# Patient Record
Sex: Male | Born: 1985 | Race: Black or African American | Hispanic: No | Marital: Married | Smoking: Current every day smoker
Health system: Southern US, Community
[De-identification: ages and names within clinical notes are randomized; demographics above are authoritative.]

## PROBLEM LIST (undated history)

## (undated) DIAGNOSIS — Z8614 Personal history of Methicillin resistant Staphylococcus aureus infection: Secondary | ICD-10-CM

## (undated) HISTORY — PX: INCISE AND DRAIN ABCESS: PRO64

---

## 2008-02-29 ENCOUNTER — Emergency Department: Payer: Self-pay | Admitting: Emergency Medicine

## 2008-05-01 ENCOUNTER — Emergency Department: Payer: Self-pay | Admitting: Emergency Medicine

## 2008-05-02 ENCOUNTER — Emergency Department: Payer: Self-pay | Admitting: Internal Medicine

## 2012-12-01 ENCOUNTER — Emergency Department: Payer: Self-pay | Admitting: Internal Medicine

## 2012-12-21 ENCOUNTER — Ambulatory Visit: Payer: Self-pay | Admitting: Orthopedic Surgery

## 2014-07-03 ENCOUNTER — Emergency Department: Payer: Self-pay | Admitting: Emergency Medicine

## 2014-07-03 LAB — URINALYSIS, COMPLETE
Bacteria: NONE SEEN
Bilirubin,UR: NEGATIVE
Blood: NEGATIVE
Glucose,UR: NEGATIVE mg/dL (ref 0–75)
Ketone: NEGATIVE
Nitrite: NEGATIVE
Ph: 6 (ref 4.5–8.0)
Protein: NEGATIVE
RBC,UR: 2 /HPF (ref 0–5)
Specific Gravity: 1.026 (ref 1.003–1.030)
Squamous Epithelial: <1
WBC UR: 32 /HPF (ref 0–5)

## 2014-07-04 LAB — GC/CHLAMYDIA PROBE AMP

## 2016-05-19 ENCOUNTER — Ambulatory Visit: Payer: Self-pay

## 2016-05-22 ENCOUNTER — Telehealth: Payer: Self-pay

## 2016-05-22 NOTE — Telephone Encounter (Signed)
Cory Griffin from company called to have the chain of custody form and DOT PE from visit 05/19/16 FAXED to [phone] ATTENTION Cory Griffin

## 2016-05-23 NOTE — Telephone Encounter (Signed)
All requests need a medical release of records

## 2017-07-24 ENCOUNTER — Other Ambulatory Visit: Payer: Self-pay

## 2017-07-24 ENCOUNTER — Emergency Department: Payer: Self-pay

## 2017-07-24 ENCOUNTER — Encounter: Payer: Self-pay | Admitting: Emergency Medicine

## 2017-07-24 ENCOUNTER — Emergency Department
Admission: EM | Admit: 2017-07-24 | Discharge: 2017-07-24 | Disposition: A | Discharge status: Home / Self Care | Payer: Self-pay | Attending: Emergency Medicine | Admitting: Emergency Medicine

## 2017-07-24 DIAGNOSIS — Y929 Unspecified place or not applicable: Secondary | ICD-10-CM | POA: Insufficient documentation

## 2017-07-24 DIAGNOSIS — Y939 Activity, unspecified: Secondary | ICD-10-CM | POA: Insufficient documentation

## 2017-07-24 DIAGNOSIS — F17210 Nicotine dependence, cigarettes, uncomplicated: Secondary | ICD-10-CM | POA: Insufficient documentation

## 2017-07-24 DIAGNOSIS — S60221A Contusion of right hand, initial encounter: Secondary | ICD-10-CM | POA: Insufficient documentation

## 2017-07-24 DIAGNOSIS — Y999 Unspecified external cause status: Secondary | ICD-10-CM | POA: Insufficient documentation

## 2017-07-24 DIAGNOSIS — W19XXXA Unspecified fall, initial encounter: Secondary | ICD-10-CM | POA: Insufficient documentation

## 2017-07-24 MED ORDER — MELOXICAM 15 MG PO TABS: 15.0000 mg | ORAL_TABLET | Freq: Every day | ORAL | 2 refills | Status: AC

## 2017-07-24 MED ORDER — TRAMADOL HCL 50 MG PO TABS: 50.0000 mg | ORAL_TABLET | Freq: Four times a day (QID) | ORAL | 0 refills | Status: DC | PRN

## 2017-07-24 NOTE — Discharge Instructions (Signed)
Please follow-up with your regular doctor or the orthopedic doctor if you are not better in 1 week, wear the splint for at least 4-5 days to help with the swelling decreased, apply ice at least 3 times a day, use the medication as needed for pain and swelling, the meloxicam should be taken daily for at least 2 weeks, return to the emergency department if you are worsening
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
## 2017-07-24 NOTE — ED Triage Notes (Signed)
Pt fell on new years eve and c/o hand swelling to right hand. Ambulatory. NAD. No obvious deformity

## 2018-05-07 ENCOUNTER — Other Ambulatory Visit
Admission: AD | Admit: 2018-05-07 | Discharge: 2018-05-07 | Disposition: A | Discharge status: Home / Self Care | Payer: Self-pay | Attending: Emergency Medicine | Admitting: Emergency Medicine

## 2018-05-07 NOTE — ED Notes (Signed)
Patient ambulatory to triage with steady gait, without difficulty or distress noted, in custody of  [place] [employer] officer Selena Batten for forensic blood draw; pt A&Ox3, with no c/o voiced and denies need to see ED provider; pt voices good understanding of blood draw to be performed for forensic testing; pt verifies identity with name and DOB; using sealed kit provided by officer, tourniquet applied to right upper arm; right antecubital region prepped with betadine swab and allowed to dry completely; needle inserted and 2 grey top blood tubes collected; tourniquet removed, needle removed & intact, dressing applied; tubes labeled, given to officer and placed in sealed container using chain of custody; pt tolerated well and continues to deny c/o or need to see ED provider; pt d/c in police custody

## 2018-11-07 ENCOUNTER — Encounter: Payer: Self-pay | Admitting: Emergency Medicine

## 2018-11-07 ENCOUNTER — Emergency Department
Admission: EM | Admit: 2018-11-07 | Discharge: 2018-11-07 | Disposition: A | Discharge status: Home / Self Care | Payer: BLUE CROSS/BLUE SHIELD | Attending: Emergency Medicine | Admitting: Emergency Medicine

## 2018-11-07 ENCOUNTER — Other Ambulatory Visit: Payer: Self-pay

## 2018-11-07 DIAGNOSIS — Z202 Contact with and (suspected) exposure to infections with a predominantly sexual mode of transmission: Secondary | ICD-10-CM | POA: Diagnosis present

## 2018-11-07 MED ORDER — AZITHROMYCIN 500 MG PO TABS
1000.0000 mg | ORAL_TABLET | Freq: Once | ORAL | Status: AC
  Administered 2018-11-07: 11:00:00 1000 mg via ORAL
  Filled 2018-11-07: qty 2

## 2018-11-07 NOTE — ED Provider Notes (Signed)
[facility] Emergency Department Provider Note  ____________________________________________  Time seen: Approximately 10:20 AM  I have reviewed the triage vital signs and the nursing notes.   HISTORY  Chief Complaint SEXUALLY TRANSMITTED DISEASE    HPI Cory Griffin. is a 33 y.o. male who presents to the emergency department for treatment of STD.  Wife was diagnosed with chlamydia earlier this week.  Patient has no symptoms.  He has no chronic medical history and denies history of medication allergies.   History reviewed. No pertinent past medical history.  There are no active problems to display for this patient.   History reviewed. No pertinent surgical history.  Prior to Admission medications   Not on File    Allergies Patient has no known allergies.  No family history on file.  Social History Social History   Tobacco Use  . Smoking status: Current Every Day Smoker  . Smokeless tobacco: Never Used  Substance Use Topics  . Alcohol use: Yes  . Drug use: No    Review of Systems Constitutional: Negative for fever. Respiratory: Negative for shortness of breath or cough. Gastrointestinal: Negative for abdominal pain; negative for nausea , negative for vomiting. Genitourinary: Negative for dysuria , negative for penile discharge. Musculoskeletal: Negative for back pain. Skin: Negative for acute skin changes/rash/lesion. ____________________________________________   PHYSICAL EXAM:  VITAL SIGNS: ED Triage Vitals [11/07/18 1007]  Enc Vitals Group     BP 130/89     Pulse Rate 84     Resp 16     Temp 98 F (36.7 C)     Temp Source Oral     SpO2 98 %     Weight      Height      Head Circumference      Peak Flow      Pain Score      Pain Loc      Pain Edu?      Excl. in GC?     Constitutional: Alert and oriented. Well appearing and in no acute distress. Eyes: Conjunctivae are normal. Head: Atraumatic. Nose: No  congestion/rhinnorhea. Mouth/Throat: Mucous membranes are moist. Respiratory: Normal respiratory effort.  No retractions. Gastrointestinal: Bowel sounds active x 4; Abdomen is soft without rebound or guarding. Genitourinary: Exam deferred Musculoskeletal: No extremity tenderness nor edema.  Neurologic:  Normal speech and language. No gross focal neurologic deficits are appreciated. Speech is normal. No gait instability. Skin:  Skin is warm, dry and intact. No rash noted on exposed skin. Psychiatric: Mood and affect are normal. Speech and behavior are normal.  ____________________________________________   LABS (all labs ordered are listed, but only abnormal results are displayed)  Labs Reviewed - No data to display ____________________________________________  RADIOLOGY  Not indicated ____________________________________________  Procedures  ____________________________________________  33 year old male presents for treatment of chlamydia after his wife was diagnosed a couple of days ago.  Will be treated with azithromycin since the type of STD is known.  He was advised to avoid intercourse for the week.  He was advised that he should follow-up with the [facility] department for symptoms of concern.  He is to return to the emergency department if unable to schedule an appointment.  INITIAL IMPRESSION / ASSESSMENT AND PLAN / ED COURSE  Pertinent labs & imaging results that were available during my care of the patient were reviewed by me and considered in my medical decision making (see chart for details).  ____________________________________________   FINAL CLINICAL IMPRESSION(S) /  ED DIAGNOSES  Final diagnoses:  Exposure to chlamydia    Note:  This document was prepared using Dragon voice recognition software and may include unintentional dictation errors.   Chinita Pesterriplett, Constantine Ruddick B, FNP 11/07/18 1351    Jeanmarie PlantMcShane, James A, MD 11/07/18 1535

## 2018-11-07 NOTE — ED Triage Notes (Signed)
PT states spouse was dx with PID and was told he needs to come and get treated. PT denies any symptoms.

## 2018-11-07 NOTE — ED Notes (Signed)
See triage note  Presents for treatment of STD  States wife was seen and dx'd with chlamydia this week  Denies any sx's at present

## 2018-11-07 NOTE — Discharge Instructions (Signed)
No intercourse for a week.  Follow up with the health department for symptoms of concern.

## 2019-07-18 DIAGNOSIS — W19XXXA Unspecified fall, initial encounter: Secondary | ICD-10-CM

## 2019-07-18 HISTORY — DX: Unspecified fall, initial encounter: W19.XXXA

## 2020-07-01 ENCOUNTER — Emergency Department (HOSPITAL_COMMUNITY): Payer: Medicaid Other

## 2020-07-01 ENCOUNTER — Encounter (HOSPITAL_COMMUNITY): Payer: Self-pay | Admitting: Emergency Medicine

## 2020-07-01 ENCOUNTER — Other Ambulatory Visit: Payer: Self-pay

## 2020-07-01 ENCOUNTER — Inpatient Hospital Stay (HOSPITAL_COMMUNITY)
Admission: EM | Admit: 2020-07-01 | Discharge: 2020-07-04 | DRG: 956 | Disposition: A | Discharge status: Home / Self Care | Payer: Medicaid Other | Attending: Surgery | Admitting: Surgery

## 2020-07-01 DIAGNOSIS — S0240CA Maxillary fracture, right side, initial encounter for closed fracture: Secondary | ICD-10-CM | POA: Diagnosis present

## 2020-07-01 DIAGNOSIS — S025XXA Fracture of tooth (traumatic), initial encounter for closed fracture: Secondary | ICD-10-CM | POA: Diagnosis present

## 2020-07-01 DIAGNOSIS — R7401 Elevation of levels of liver transaminase levels: Secondary | ICD-10-CM | POA: Diagnosis present

## 2020-07-01 DIAGNOSIS — Z23 Encounter for immunization: Secondary | ICD-10-CM

## 2020-07-01 DIAGNOSIS — S0081XA Abrasion of other part of head, initial encounter: Secondary | ICD-10-CM | POA: Diagnosis present

## 2020-07-01 DIAGNOSIS — S02401A Maxillary fracture, unspecified, initial encounter for closed fracture: Secondary | ICD-10-CM

## 2020-07-01 DIAGNOSIS — S7290XA Unspecified fracture of unspecified femur, initial encounter for closed fracture: Secondary | ICD-10-CM

## 2020-07-01 DIAGNOSIS — J939 Pneumothorax, unspecified: Secondary | ICD-10-CM

## 2020-07-01 DIAGNOSIS — S52102A Unspecified fracture of upper end of left radius, initial encounter for closed fracture: Secondary | ICD-10-CM

## 2020-07-01 DIAGNOSIS — S0219XA Other fracture of base of skull, initial encounter for closed fracture: Secondary | ICD-10-CM | POA: Diagnosis present

## 2020-07-01 DIAGNOSIS — Z20822 Contact with and (suspected) exposure to covid-19: Secondary | ICD-10-CM | POA: Diagnosis present

## 2020-07-01 DIAGNOSIS — S0231XA Fracture of orbital floor, right side, initial encounter for closed fracture: Secondary | ICD-10-CM | POA: Diagnosis present

## 2020-07-01 DIAGNOSIS — S62111A Displaced fracture of triquetrum [cuneiform] bone, right wrist, initial encounter for closed fracture: Secondary | ICD-10-CM

## 2020-07-01 DIAGNOSIS — S728X1A Other fracture of right femur, initial encounter for closed fracture: Principal | ICD-10-CM | POA: Diagnosis present

## 2020-07-01 DIAGNOSIS — T1490XA Injury, unspecified, initial encounter: Secondary | ICD-10-CM

## 2020-07-01 DIAGNOSIS — T148XXA Other injury of unspecified body region, initial encounter: Secondary | ICD-10-CM

## 2020-07-01 DIAGNOSIS — S2241XA Multiple fractures of ribs, right side, initial encounter for closed fracture: Secondary | ICD-10-CM

## 2020-07-01 DIAGNOSIS — W19XXXA Unspecified fall, initial encounter: Secondary | ICD-10-CM | POA: Diagnosis present

## 2020-07-01 DIAGNOSIS — W132XXA Fall from, out of or through roof, initial encounter: Secondary | ICD-10-CM | POA: Diagnosis present

## 2020-07-01 DIAGNOSIS — S0285XA Fracture of orbit, unspecified, initial encounter for closed fracture: Secondary | ICD-10-CM

## 2020-07-01 DIAGNOSIS — S52134A Nondisplaced fracture of neck of right radius, initial encounter for closed fracture: Secondary | ICD-10-CM | POA: Diagnosis present

## 2020-07-01 DIAGNOSIS — F17200 Nicotine dependence, unspecified, uncomplicated: Secondary | ICD-10-CM | POA: Diagnosis present

## 2020-07-01 DIAGNOSIS — Z5320 Procedure and treatment not carried out because of patient's decision for unspecified reasons: Secondary | ICD-10-CM | POA: Diagnosis not present

## 2020-07-01 DIAGNOSIS — S72001A Fracture of unspecified part of neck of right femur, initial encounter for closed fracture: Secondary | ICD-10-CM

## 2020-07-01 DIAGNOSIS — S270XXA Traumatic pneumothorax, initial encounter: Secondary | ICD-10-CM

## 2020-07-01 LAB — COMPREHENSIVE METABOLIC PANEL
ALT: 59 U/L — ABNORMAL HIGH (ref 0–44)
AST: 61 U/L — ABNORMAL HIGH (ref 15–41)
Albumin: 3.9 g/dL (ref 3.5–5.0)
Alkaline Phosphatase: 52 U/L (ref 38–126)
Anion gap: 12 (ref 5–15)
BUN: 7 mg/dL (ref 6–20)
CO2: 23 mmol/L (ref 22–32)
Calcium: 8.8 mg/dL — ABNORMAL LOW (ref 8.9–10.3)
Chloride: 98 mmol/L (ref 98–111)
Creatinine, Ser: 0.76 mg/dL (ref 0.61–1.24)
GFR, Estimated: >60 mL/min (ref >60)
Glucose, Bld: 119 mg/dL — ABNORMAL HIGH (ref 70–99)
Potassium: 3.8 mmol/L (ref 3.5–5.1)
Sodium: 133 mmol/L — ABNORMAL LOW (ref 135–145)
Total Bilirubin: 1.3 mg/dL — ABNORMAL HIGH (ref 0.3–1.2)
Total Protein: 6.2 g/dL — ABNORMAL LOW (ref 6.5–8.1)

## 2020-07-01 LAB — CBC WITH DIFFERENTIAL/PLATELET
Abs Immature Granulocytes: 0.06 10*3/uL (ref 0.00–0.07)
Basophils Absolute: 0 10*3/uL (ref 0.0–0.1)
Basophils Relative: 0 %
Eosinophils Absolute: 0 10*3/uL (ref 0.0–0.5)
Eosinophils Relative: 0 %
HCT: 43.4 % (ref 39.0–52.0)
Hemoglobin: 14.5 g/dL (ref 13.0–17.0)
Immature Granulocytes: 0 %
Lymphocytes Relative: 7 %
Lymphs Abs: 1 10*3/uL (ref 0.7–4.0)
MCH: 33.9 pg (ref 26.0–34.0)
MCHC: 33.4 g/dL (ref 30.0–36.0)
MCV: 101.4 fL — ABNORMAL HIGH (ref 80.0–100.0)
Monocytes Absolute: 0.8 10*3/uL (ref 0.1–1.0)
Monocytes Relative: 6 %
Neutro Abs: 12.8 10*3/uL — ABNORMAL HIGH (ref 1.7–7.7)
Neutrophils Relative %: 87 %
Platelets: 177 10*3/uL (ref 150–400)
RBC: 4.28 MIL/uL (ref 4.22–5.81)
RDW: 11.5 % (ref 11.5–15.5)
WBC: 14.8 10*3/uL — ABNORMAL HIGH (ref 4.0–10.5)
nRBC: 0 % (ref 0.0–0.2)

## 2020-07-01 LAB — URINALYSIS, ROUTINE W REFLEX MICROSCOPIC
Bilirubin Urine: NEGATIVE
Glucose, UA: NEGATIVE mg/dL
Hgb urine dipstick: NEGATIVE
Ketones, ur: NEGATIVE mg/dL
Leukocytes,Ua: NEGATIVE
Nitrite: NEGATIVE
Protein, ur: NEGATIVE mg/dL
Specific Gravity, Urine: 1.041 — ABNORMAL HIGH (ref 1.005–1.030)
pH: 7 (ref 5.0–8.0)

## 2020-07-01 LAB — I-STAT CHEM 8, ED
BUN: 7 mg/dL (ref 6–20)
Calcium, Ion: 1.05 mmol/L — ABNORMAL LOW (ref 1.15–1.40)
Chloride: 102 mmol/L (ref 98–111)
Creatinine, Ser: 0.6 mg/dL — ABNORMAL LOW (ref 0.61–1.24)
Glucose, Bld: 120 mg/dL — ABNORMAL HIGH (ref 70–99)
HCT: 45 % (ref 39.0–52.0)
Hemoglobin: 15.3 g/dL (ref 13.0–17.0)
Potassium: 3.5 mmol/L (ref 3.5–5.1)
Sodium: 135 mmol/L (ref 135–145)
TCO2: 23 mmol/L (ref 22–32)

## 2020-07-01 LAB — SAMPLE TO BLOOD BANK

## 2020-07-01 LAB — RESP PANEL BY RT-PCR (FLU A&B, COVID) ARPGX2
Influenza A by PCR: NEGATIVE
Influenza B by PCR: NEGATIVE
SARS Coronavirus 2 by RT PCR: NEGATIVE

## 2020-07-01 LAB — LACTIC ACID, PLASMA: Lactic Acid, Venous: 1.2 mmol/L (ref 0.5–1.9)

## 2020-07-01 LAB — PROTIME-INR
INR: 1 (ref 0.8–1.2)
Prothrombin Time: 13 seconds (ref 11.4–15.2)

## 2020-07-01 LAB — ETHANOL: Alcohol, Ethyl (B): <10 mg/dL (ref <10)

## 2020-07-01 MED ORDER — FENTANYL CITRATE (PF) 100 MCG/2ML IJ SOLN
50.0000 ug | Freq: Once | INTRAMUSCULAR | Status: AC | PRN
  Administered 2020-07-01: 50 ug via INTRAVENOUS
  Filled 2020-07-01: qty 2

## 2020-07-01 MED ORDER — OXYCODONE HCL 5 MG PO TABS: 5.0000 mg | ORAL_TABLET | ORAL | Status: DC | PRN

## 2020-07-01 MED ORDER — MORPHINE SULFATE (PF) 4 MG/ML IV SOLN
4.0000 mg | INTRAVENOUS | Status: DC | PRN
  Administered 2020-07-01: 4 mg via INTRAVENOUS
  Filled 2020-07-01: qty 1

## 2020-07-01 MED ORDER — TETANUS-DIPHTH-ACELL PERTUSSIS 5-2.5-18.5 LF-MCG/0.5 IM SUSY
0.5000 mL | PREFILLED_SYRINGE | Freq: Once | INTRAMUSCULAR | Status: AC
  Administered 2020-07-01: 16:00:00 0.5 mL via INTRAMUSCULAR
  Filled 2020-07-01: qty 0.5

## 2020-07-01 MED ORDER — FENTANYL CITRATE (PF) 100 MCG/2ML IJ SOLN: 50.0000 ug | Freq: Once | INTRAMUSCULAR | Status: DC

## 2020-07-01 MED ORDER — ENOXAPARIN SODIUM 30 MG/0.3ML ~~LOC~~ SOLN
30.0000 mg | Freq: Two times a day (BID) | SUBCUTANEOUS | Status: DC
  Administered 2020-07-02: 09:00:00 30 mg via SUBCUTANEOUS
  Filled 2020-07-01: qty 0.3

## 2020-07-01 MED ORDER — OXYCODONE HCL 5 MG PO TABS
10.0000 mg | ORAL_TABLET | ORAL | Status: DC | PRN
  Administered 2020-07-01 – 2020-07-02 (×3): 10 mg via ORAL
  Filled 2020-07-01 (×3): qty 2

## 2020-07-01 MED ORDER — MORPHINE SULFATE (PF) 2 MG/ML IV SOLN
1.0000 mg | INTRAVENOUS | Status: DC | PRN
Start: 2020-07-01 — End: 2020-07-01

## 2020-07-01 MED ORDER — METHOCARBAMOL 1000 MG/10ML IJ SOLN
500.0000 mg | Freq: Three times a day (TID) | INTRAVENOUS | Status: DC
  Administered 2020-07-01 – 2020-07-02 (×2): 500 mg via INTRAVENOUS
  Filled 2020-07-01 (×9): qty 5

## 2020-07-01 MED ORDER — LACTATED RINGERS IV SOLN
INTRAVENOUS | Status: AC | PRN
  Administered 2020-07-01: 1000 mL via INTRAVENOUS

## 2020-07-01 MED ORDER — ONDANSETRON HCL 4 MG/2ML IJ SOLN: 4.0000 mg | Freq: Four times a day (QID) | INTRAMUSCULAR | Status: DC | PRN

## 2020-07-01 MED ORDER — ACETAMINOPHEN 500 MG PO TABS
1000.0000 mg | ORAL_TABLET | Freq: Three times a day (TID) | ORAL | Status: DC
  Administered 2020-07-02 – 2020-07-04 (×5): 1000 mg via ORAL
  Filled 2020-07-01 (×7): qty 2

## 2020-07-01 MED ORDER — POTASSIUM CHLORIDE IN NACL 20-0.9 MEQ/L-% IV SOLN
INTRAVENOUS | Status: DC
  Filled 2020-07-01 (×5): qty 1000

## 2020-07-01 MED ORDER — PANTOPRAZOLE SODIUM 40 MG PO TBEC
40.0000 mg | DELAYED_RELEASE_TABLET | Freq: Every day | ORAL | Status: DC
  Administered 2020-07-01 – 2020-07-04 (×4): 40 mg via ORAL
  Filled 2020-07-01 (×4): qty 1

## 2020-07-01 MED ORDER — MORPHINE SULFATE (PF) 2 MG/ML IV SOLN
1.0000 mg | INTRAVENOUS | Status: DC | PRN
  Administered 2020-07-01 – 2020-07-02 (×4): 2 mg via INTRAVENOUS
  Filled 2020-07-01 (×4): qty 1

## 2020-07-01 MED ORDER — ONDANSETRON 4 MG PO TBDP
4.0000 mg | ORAL_TABLET | Freq: Four times a day (QID) | ORAL | Status: DC | PRN
  Administered 2020-07-01: 21:00:00 4 mg via ORAL
  Filled 2020-07-01 (×2): qty 1

## 2020-07-01 MED ORDER — SODIUM CHLORIDE 0.9 % IV SOLN: Freq: Once | INTRAVENOUS | Status: DC

## 2020-07-01 MED ORDER — PANTOPRAZOLE SODIUM 40 MG IV SOLR
40.0000 mg | Freq: Every day | INTRAVENOUS | Status: DC
  Administered 2020-07-01: 21:00:00 40 mg via INTRAVENOUS
  Filled 2020-07-01 (×2): qty 40

## 2020-07-01 MED ORDER — IOHEXOL 300 MG/ML  SOLN
100.0000 mL | Freq: Once | INTRAMUSCULAR | Status: AC | PRN
  Administered 2020-07-01: 16:00:00 100 mL via INTRAVENOUS

## 2020-07-01 MED ORDER — DOCUSATE SODIUM 100 MG PO CAPS
100.0000 mg | ORAL_CAPSULE | Freq: Two times a day (BID) | ORAL | Status: DC
  Administered 2020-07-02 – 2020-07-03 (×2): 100 mg via ORAL
  Filled 2020-07-01 (×5): qty 1

## 2020-07-01 NOTE — Progress Notes (Signed)
Orthopedic Tech Progress Note Patient Details:  Cory Griffin 1986-02-28 [id number]  Patient ID: [name], male   DOB: 02-23-1986, 34 y.o.   MRN: [id number]  Level 2 Trauma. [name] A [name] 07/01/2020, 4:13 PM
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
## 2020-07-01 NOTE — ED Provider Notes (Signed)
Patient not in room for evaluation. I did not see or evaluate this patient prior to shift change.   Note: Portions of this report may have been transcribed using voice recognition software. Every effort was made to ensure accuracy; however, inadvertent computerized transcription errors may still be present.    Bill Salinas, PA-C 07/01/20 1458    Gwyneth Sprout, MD 07/02/20 1322

## 2020-07-01 NOTE — H&P (Addendum)
CC: I hurt on my right side  Requesting provider: dr Charm Barges  HPI: Cory Griffin. is an 34 y.o. male who is here for evaluation after falling off of a roof as level 2 trauma alert. Reportedly fell 60ft, landing on right side, gcs 15, reporedly had 1 beer earlier today. Evaluated and worked up by ED and called to admit given multiple systems. Pt denies neck pain, abd pain and LUE, LLE, right tib/foot pain. C/o right sided pain - elbow, chest, hand, hip.   Denies pmhx, psx, all, meds, family hx Smokes cig daily, pack per 1.5 day Some etoh  Denies other drugs History reviewed. No pertinent past medical history.  History reviewed. No pertinent surgical history.  No family history on file.  Social:  reports that he has been smoking. He has never used smokeless tobacco. He reports current alcohol use. He reports that he does not use drugs.  Allergies: No Known Allergies  Medications: I have reviewed the patient's current medications.   ROS - all of the below systems have been reviewed with the patient and positives are indicated with bold text General: chills, fever or night sweats Eyes: blurry vision or double vision ENT: epistaxis or sore throat Allergy/Immunology: itchy/watery eyes or nasal congestion Hematologic/Lymphatic: bleeding problems, blood clots or swollen lymph nodes Endocrine: temperature intolerance or unexpected weight changes Breast: new or changing breast lumps or nipple discharge Resp: cough, shortness of breath, or wheezing CV: chest pain or dyspnea on exertion GI: as per HPI GU: dysuria, trouble voiding, or hematuria MSK: joint pain or joint stiffness; see hpi Neuro: TIA or stroke symptoms Derm: pruritus and skin lesion changes Psych: anxiety and depression  PE Blood pressure 101/89, pulse 96, temperature (!) 97 F (36.1 C), resp. rate 16, height 6\' 1"  (1.854 m), weight 74.4 kg, SpO2 98 %. Constitutional: NAD; conversant; appears uncomfortable at  times Eyes: Moist conjunctiva; no lid lag; anicteric; PERRL Face: some right facial tenderness  Neck: Trachea midline; no thyromegaly, supple, FROM Lungs: Normal respiratory effort; no tactile fremitus CV: RRR; no palpable thrills; no pitting edema GI: Abd soft, nontender, nondistended; no palpable hepatosplenomegaly MSK: no clubbing/cyanosis; TTP right hip/prox femur, TTP right elbow, prox radius, right hand; o/w nontender to palpation LUE, LLE, b/l feet; good 2+ pulses dp/pt/radial b/l Psychiatric: Appropriate affect; alert and oriented x3 Lymphatic: No palpable cervical or axillary lymphadenopathy Skin:no rash, lesions, nodules  Results for orders placed or performed during the hospital encounter of 07/01/20 (from the past 48 hour(s))  Comprehensive metabolic panel     Status: Abnormal   Collection Time: 07/01/20  4:24 PM  Result Value Ref Range   Sodium 133 (L) 135 - 145 mmol/L   Potassium 3.8 3.5 - 5.1 mmol/L   Chloride 98 98 - 111 mmol/L   CO2 23 22 - 32 mmol/L   Glucose, Bld 119 (H) 70 - 99 mg/dL    Comment: Glucose reference range applies only to samples taken after fasting for at least 8 hours.   BUN 7 6 - 20 mg/dL   Creatinine, Ser 07/03/20 0.61 - 1.24 mg/dL   Calcium 8.8 (L) 8.9 - 10.3 mg/dL   Total Protein 6.2 (L) 6.5 - 8.1 g/dL   Albumin 3.9 3.5 - 5.0 g/dL   AST 61 (H) 15 - 41 U/L   ALT 59 (H) 0 - 44 U/L   Alkaline Phosphatase 52 38 - 126 U/L   Total Bilirubin 1.3 (H) 0.3 - 1.2 mg/dL  GFR, Estimated >60 >60 mL/min    Comment: (NOTE) Calculated using the CKD-EPI Creatinine Equation (2021)    Anion gap 12 5 - 15    Comment: Performed at [facility], [address] [place], [place] [address]  Ethanol     Status: None   Collection Time: 07/01/20  4:24 PM  Result Value Ref Range   Alcohol, Ethyl (B) <10 <10 mg/dL    Comment: (NOTE) Lowest detectable limit for serum alcohol is 10 mg/dL.  For medical purposes only. Performed at [facility], [address], [place], [place] [address]   Lactic acid, plasma     Status: None   Collection Time: 07/01/20  4:24 PM  Result Value Ref Range   Lactic Acid, Venous 1.2 0.5 - 1.9 mmol/L    Comment: Performed at [facility], [address] [place], [place] [address]  Protime-INR     Status: None   Collection Time: 07/01/20  4:24 PM  Result Value Ref Range   Prothrombin Time 13.0 11.4 - 15.2 seconds   INR 1.0 0.8 - 1.2    Comment: (NOTE) INR goal varies based on device and disease states. Performed at [facility], [address] [place], [place] [address]   Sample to Blood Bank     Status: None   Collection Time: 07/01/20  4:24 PM  Result Value Ref Range   Blood Bank Specimen SAMPLE AVAILABLE FOR TESTING    Sample Expiration      07/02/2020,2359 Performed at [facility], [address], [place], [place] [address]   Resp Panel by RT-PCR (Flu A&B, Covid) Nasopharyngeal Swab     Status: None   Collection Time: 07/01/20  4:24 PM   Specimen: Nasopharyngeal Swab; Nasopharyngeal(NP) swabs in vial transport medium  Result Value Ref Range   SARS Coronavirus 2 by RT PCR NEGATIVE NEGATIVE    Comment: (NOTE) SARS-CoV-2 target nucleic acids are NOT DETECTED.  The SARS-CoV-2 RNA is generally detectable in upper respiratory specimens during the acute phase of infection. The lowest concentration of SARS-CoV-2 viral copies this assay can detect is 138 copies/mL. A negative result does not preclude SARS-Cov-2 infection and should not be used as the sole basis for treatment or other patient management decisions. A negative result may occur with  improper specimen collection/handling, submission of specimen other than nasopharyngeal swab, presence of viral mutation(s) within the areas targeted by this assay, and inadequate number of viral copies(<138 copies/mL). A negative result must be combined with clinical observations, patient history, and epidemiological information. The  expected result is Negative.  Fact Sheet for Patients:  BloggerCourse.com  Fact Sheet for Healthcare Providers:  SeriousBroker.it  This test is no t yet approved or cleared by the [place] FDA and  has been authorized for detection and/or diagnosis of SARS-CoV-2 by FDA under an Emergency Use Authorization (EUA). This EUA will remain  in effect (meaning this test can be used) for the duration of the COVID-19 declaration under Section 564(b)(1) of the Act, 21 U.S.C.section 360bbb-3(b)(1), unless the authorization is terminated  or revoked sooner.       Influenza A by PCR NEGATIVE NEGATIVE   Influenza B by PCR NEGATIVE NEGATIVE    Comment: (NOTE) The Xpert Xpress SARS-CoV-2/FLU/RSV plus assay is intended as an aid in the diagnosis of influenza from Nasopharyngeal swab specimens and should not be used as a sole basis for treatment. Nasal washings and aspirates are unacceptable  for Xpert Xpress SARS-CoV-2/FLU/RSV testing.  Fact Sheet for Patients: BloggerCourse.com  Fact Sheet for Healthcare Providers: SeriousBroker.it  This test is not yet approved or cleared by the [place] FDA and has been authorized for detection and/or diagnosis of SARS-CoV-2 by FDA under an Emergency Use Authorization (EUA). This EUA will remain in effect (meaning this test can be used) for the duration of the COVID-19 declaration under Section 564(b)(1) of the Act, 21 U.S.C. section 360bbb-3(b)(1), unless the authorization is terminated or revoked.  Performed at [facility], [address] [place], [place] [address]   CBC with Differential     Status: Abnormal   Collection Time: 07/01/20  4:24 PM  Result Value Ref Range   WBC 14.8 (H) 4.0 - 10.5 K/uL   RBC 4.28 4.22 - 5.81 MIL/uL   Hemoglobin 14.5 13.0 - 17.0 g/dL   HCT 60.4 54.0 - 98.1 %   MCV 101.4 (H) 80.0 - 100.0 fL   MCH 33.9  26.0 - 34.0 pg   MCHC 33.4 30.0 - 36.0 g/dL   RDW 19.1 47.8 - 29.5 %   Platelets 177 150 - 400 K/uL   nRBC 0.0 0.0 - 0.2 %   Neutrophils Relative % 87 %   Neutro Abs 12.8 (H) 1.7 - 7.7 K/uL   Lymphocytes Relative 7 %   Lymphs Abs 1.0 0.7 - 4.0 K/uL   Monocytes Relative 6 %   Monocytes Absolute 0.8 0.1 - 1.0 K/uL   Eosinophils Relative 0 %   Eosinophils Absolute 0.0 0.0 - 0.5 K/uL   Basophils Relative 0 %   Basophils Absolute 0.0 0.0 - 0.1 K/uL   Immature Granulocytes 0 %   Abs Immature Granulocytes 0.06 0.00 - 0.07 K/uL    Comment: Performed at [facility], [address] [place], [place] [address]  I-Stat Chem 8, ED     Status: Abnormal   Collection Time: 07/01/20  4:41 PM  Result Value Ref Range   Sodium 135 135 - 145 mmol/L   Potassium 3.5 3.5 - 5.1 mmol/L   Chloride 102 98 - 111 mmol/L   BUN 7 6 - 20 mg/dL   Creatinine, Ser 8.65 (L) 0.61 - 1.24 mg/dL   Glucose, Bld 784 (H) 70 - 99 mg/dL    Comment: Glucose reference range applies only to samples taken after fasting for at least 8 hours.   Calcium, Ion 1.05 (L) 1.15 - 1.40 mmol/L   TCO2 23 22 - 32 mmol/L   Hemoglobin 15.3 13.0 - 17.0 g/dL   HCT 69.6 29.5 - 28.4 %    DG Chest 1 View  Result Date: 07/01/2020 CLINICAL DATA:  Fall from roof EXAM: CHEST  1 VIEW COMPARISON:  CT 07/01/2020 FINDINGS: The heart size and mediastinal contours are within normal limits. Both lungs are clear. The visualized skeletal structures are unremarkable. Nondisplaced fractures identified on companion CT involving the posterior RIGHT lower ribs are not evident by plain film. IMPRESSION: 1. Posterior RIGHT rib fracture identified on companion CT are not clearly evident. 2. No pneumothorax. Electronically Signed   By: Genevive Bi M.D.   On: 07/01/2020 16:08   DG Shoulder Right  Result Date: 07/01/2020 CLINICAL DATA:  34 year old male with fall and right shoulder pain. EXAM: RIGHT SHOULDER - 2+ VIEW COMPARISON:  None. FINDINGS: There  is no acute fracture or dislocation of the right shoulder. There are fractures of the posterior as well as anterior right seventh rib. Dedicated rib series  may provide better evaluation. Probable mild subcutaneous contusion lateral to the shoulder. No radiopaque foreign object or soft tissue gas. IMPRESSION: 1. No acute fracture or dislocation of the right shoulder. 2. Fractures of the posterior and anterior right seventh rib. Dedicated rib series may provide better evaluation. Electronically Signed   By: Elgie Collard M.D.   On: 07/01/2020 15:32   DG Elbow 2 Views Right  Result Date: 07/01/2020 CLINICAL DATA:  Fall from roof with elbow pain, initial encounter EXAM: RIGHT ELBOW - 1 VIEW COMPARISON:  None. FINDINGS: Transverse fracture is noted through the radial neck just below the radial head with elevation of the anterior and posterior fat pads consistent with joint effusion. No other fracture is noted. IMPRESSION: Proximal radial fracture involving the neck as described. Electronically Signed   By: Alcide Clever M.D.   On: 07/01/2020 17:26   DG Forearm Right  Result Date: 07/01/2020 CLINICAL DATA:  Fall from roof with forearm pain, initial encounter EXAM: RIGHT FOREARM - 2 VIEW COMPARISON:  None. FINDINGS: There is a fracture through the radial neck with only minimal displacement identified. No other focal abnormality is noted. IMPRESSION: Proximal radius fracture involving the neck incompletely evaluated on this exam. Electronically Signed   By: Alcide Clever M.D.   On: 07/01/2020 17:25   DG Wrist Complete Right  Result Date: 07/01/2020 CLINICAL DATA:  Fall off roof with right wrist pain. EXAM: RIGHT WRIST - COMPLETE 3+ VIEW COMPARISON:  None. FINDINGS: There is no evidence of fracture or dislocation. There is no evidence of arthropathy or other focal bone abnormality. Soft tissues are unremarkable. IMPRESSION: Negative radiographs of the right wrist. Electronically Signed   By: Narda Rutherford  M.D.   On: 07/01/2020 15:31   CT Head Wo Contrast  Result Date: 07/01/2020 CLINICAL DATA:  Head trauma, moderate/severe. Neck trauma, dangerous injury mechanism. Additional history provided: Fall off of roof. EXAM: CT HEAD WITHOUT CONTRAST CT CERVICAL SPINE WITHOUT CONTRAST TECHNIQUE: Multidetector CT imaging of the head and cervical spine was performed following the standard protocol without intravenous contrast. Multiplanar CT image reconstructions of the cervical spine were also generated. COMPARISON:  No pertinent prior exams available for comparison. FINDINGS: CT HEAD FINDINGS Brain: Cerebral volume is normal. There is no acute intracranial hemorrhage. No demarcated cortical infarct. No extra-axial fluid collection. No evidence of intracranial mass. No midline shift. Vascular: No hyperdense vessel. Skull: Deformity of the right sphenoid bone along the lateral aspect of the right orbital apex, likely reflecting a mildly displaced acute fracture (for instance as seen on series 4, image 31) (series 5, image 25). Sinuses/Orbits: Subtle irregularity of the right orbital floor is questioned and a nondisplaced fracture is difficult to exclude. Right periorbital and maxillofacial soft tissue swelling. Acute, mildly displaced fractures of the posterolateral wall of the right maxillary sinus. Air-fluid level within the right maxillary sinus likely reflecting the presence of hemorrhage. Small volume frothy secretions, small air-fluid level and small mucous retention cyst within the right sphenoid sinus. Mild paranasal sinus mucosal thickening elsewhere. Other: Nondisplaced acute fracture of the right maxillary hard palate (for instance as seen on series 4, image 12). CT CERVICAL SPINE FINDINGS Alignment: Mild nonspecific reversal of the expected cervical lordosis. No significant spondylolisthesis. Skull base and vertebrae: The basion-dental and atlanto-dental intervals are maintained.No evidence of acute fracture to  the cervical spine. Soft tissues and spinal canal: No prevertebral fluid or swelling. No visible canal hematoma. Disc levels: No significant bony spinal canal or neural foraminal  narrowing at any level. Upper chest: No visible airspace consolidation or pneumothorax. IMPRESSION: CT head: 1. No evidence of acute intracranial abnormality. 2. Mildly displaced fracture of the right sphenoid bone along the lateral aspect of the right orbital apex. 3. Acute, mildly displaced fractures of the posterolateral wall of the right maxillary sinus. Nondisplaced acute fracture through the right maxillary hard palate. Subtle irregularity of the right orbital floor is also questioned and a nondisplaced right orbital floor fracture is difficult to exclude. A dedicated maxillofacial CT is recommended for further evaluation of these maxillofacial and skull base fractures. 4. Large air-fluid level within the right maxillary sinus likely reflecting the presence of blood products. 5. A small air-fluid level is also present within the right sphenoid sinus, which may reflect secretions or blood products. 6. Right periorbital/maxillofacial hematoma. CT cervical spine: 1. No evidence of acute fracture to the cervical spine. 2. Mild nonspecific reversal of the expected cervical lordosis. Electronically Signed   By: Jackey Loge DO   On: 07/01/2020 16:15   CT CHEST W CONTRAST  Result Date: 07/01/2020 CLINICAL DATA:  Status post fall from roof approximately 9 feet high. Presenting with hip pain. EXAM: CT ABDOMEN AND PELVIS WITH CONTRAST TECHNIQUE: Multidetector CT imaging of the abdomen and pelvis was performed using the standard protocol following bolus administration of intravenous contrast. CONTRAST:  OMNIPAQUE IOHEXOL 300 MG/ML  SOLN COMPARISON:  None. FINDINGS: CHEST: Ports and Devices: None. Lungs/airways: Bilateral lower lobe subsegmental atelectasis. Nonspecific thin walled cystic lesion within the right upper lobe (5:39). No  focal consolidation. No pulmonary nodule. No pulmonary mass. No definite pulmonary contusion or laceration. No pneumatocele formation. The central airways are patent. Pleura: No pleural effusion. Trace posterior right pneumothorax (5:76). No left pneumothorax. No hemothorax. Lymph Nodes: No mediastinal, hilar, or axillary lymphadenopathy. Mediastinum: No pneumomediastinum. No aortic injury or mediastinal hematoma. The thoracic aorta is normal in caliber. The heart is normal in size. No significant pericardial effusion. The esophagus is unremarkable. The thyroid is unremarkable. Chest Wall / Breasts: No chest wall mass. Musculoskeletal: No sternal fracture. Visualized portions of the scapula demonstrate no acute displaced fracture. Nondisplaced acute fracture of the right posterior fourth through eighth ribs with the right posterior seventh rib minimally displaced (1 mm). Acute nondisplaced fracture of the anterior right third and fourth ribs. No spinal fracture. Visualized portions of the right upper extremity suggestive of an avulsion fracture of the triquetrum. ABDOMEN / PELVIS: Liver: Not enlarged. No focal lesion. No laceration or subcapsular hematoma. Biliary System: The gallbladder is otherwise unremarkable with no radio-opaque gallstones. No biliary ductal dilatation. Pancreas: Normal pancreatic contour. No main pancreatic duct dilatation. Spleen: Not enlarged. No focal lesion. No laceration, subcapsular hematoma, or vascular injury. Adrenal Glands: No nodularity bilaterally. Kidneys: Bilateral kidneys enhance symmetrically. No hydronephrosis. No contusion, laceration, or subcapsular hematoma. No injury to the vascular structures or collecting systems. No hydroureter. On delayed imaging, there is no urothelial wall thickening and there are no filling defects in the opacified portions of the bilateral collecting systems or ureters. The urinary bladder is unremarkable. Bowel: No small or large bowel wall  thickening or dilatation. The appendix is not definitely identified. Mesentery, Omentum, and Peritoneum: No simple free fluid ascites. No pneumoperitoneum. No hemoperitoneum. No mesenteric hematoma identified. No organized fluid collection. Pelvic Organs: Normal. Lymph Nodes: No abdominal, pelvic, inguinal lymphadenopathy. Vasculature: No abdominal aorta or iliac aneurysm. No active contrast extravasation or pseudoaneurysm. Musculoskeletal: No spinal fracture. No acute pelvic fracture; however, partially visualized  acute comminuted fracture right greater trochanter extending to the right femoral neck as well as the right proximal femoral shaft. Associated subcutaneus soft tissue edema of the right gluteus medius as well as the right vastus intermedius muscle. IMPRESSION: 1. Trace (sliver) posterior right pneumothorax with associated right anterior third and fourth rib fractures as well as right posterior fourth through eighth ribs fractures. All these fracture are nondisplaced other than the right posterior seventh rib that is minimally displaced (1mm). 2. Acute comminuted fracture of the right femoral greater trochanter that extends to involve the right femoral neck as well as the right proximal femoral shaft. Femoral shaft fracture only partially visualized. Associated subcutaneus soft tissue edema of the right gluteus medius as well as the right vastus intermedius muscle with intramuscular hematoma not excluded. Recommend dedicated right femur x-rays. 3. Suggestion of an avulsion fracture of the right triquetrum. Recommend dedicated right hand x-rays. 4. No acute intra-abdominal or intrapelvic traumatic injury. 5. No acute fracture or traumatic malalignment of the thoracic or lumbar spine. These results were called by telephone at the time of interpretation on 07/01/2020 at 4:13 pm to provider Dr. Charm Barges, who verbally acknowledged these results. Electronically Signed   By: Tish Frederickson M.D.   On: 07/01/2020  16:14   CT Cervical Spine Wo Contrast  Result Date: 07/01/2020 CLINICAL DATA:  Head trauma, moderate/severe. Neck trauma, dangerous injury mechanism. Additional history provided: Fall off of roof. EXAM: CT HEAD WITHOUT CONTRAST CT CERVICAL SPINE WITHOUT CONTRAST TECHNIQUE: Multidetector CT imaging of the head and cervical spine was performed following the standard protocol without intravenous contrast. Multiplanar CT image reconstructions of the cervical spine were also generated. COMPARISON:  No pertinent prior exams available for comparison. FINDINGS: CT HEAD FINDINGS Brain: Cerebral volume is normal. There is no acute intracranial hemorrhage. No demarcated cortical infarct. No extra-axial fluid collection. No evidence of intracranial mass. No midline shift. Vascular: No hyperdense vessel. Skull: Deformity of the right sphenoid bone along the lateral aspect of the right orbital apex, likely reflecting a mildly displaced acute fracture (for instance as seen on series 4, image 31) (series 5, image 25). Sinuses/Orbits: Subtle irregularity of the right orbital floor is questioned and a nondisplaced fracture is difficult to exclude. Right periorbital and maxillofacial soft tissue swelling. Acute, mildly displaced fractures of the posterolateral wall of the right maxillary sinus. Air-fluid level within the right maxillary sinus likely reflecting the presence of hemorrhage. Small volume frothy secretions, small air-fluid level and small mucous retention cyst within the right sphenoid sinus. Mild paranasal sinus mucosal thickening elsewhere. Other: Nondisplaced acute fracture of the right maxillary hard palate (for instance as seen on series 4, image 12). CT CERVICAL SPINE FINDINGS Alignment: Mild nonspecific reversal of the expected cervical lordosis. No significant spondylolisthesis. Skull base and vertebrae: The basion-dental and atlanto-dental intervals are maintained.No evidence of acute fracture to the cervical  spine. Soft tissues and spinal canal: No prevertebral fluid or swelling. No visible canal hematoma. Disc levels: No significant bony spinal canal or neural foraminal narrowing at any level. Upper chest: No visible airspace consolidation or pneumothorax. IMPRESSION: CT head: 1. No evidence of acute intracranial abnormality. 2. Mildly displaced fracture of the right sphenoid bone along the lateral aspect of the right orbital apex. 3. Acute, mildly displaced fractures of the posterolateral wall of the right maxillary sinus. Nondisplaced acute fracture through the right maxillary hard palate. Subtle irregularity of the right orbital floor is also questioned and a nondisplaced right orbital floor fracture is  difficult to exclude. A dedicated maxillofacial CT is recommended for further evaluation of these maxillofacial and skull base fractures. 4. Large air-fluid level within the right maxillary sinus likely reflecting the presence of blood products. 5. A small air-fluid level is also present within the right sphenoid sinus, which may reflect secretions or blood products. 6. Right periorbital/maxillofacial hematoma. CT cervical spine: 1. No evidence of acute fracture to the cervical spine. 2. Mild nonspecific reversal of the expected cervical lordosis. Electronically Signed   By: Jackey LogeKyle  Golden DO   On: 07/01/2020 16:15   CT ABDOMEN PELVIS W CONTRAST  Result Date: 07/01/2020 CLINICAL DATA:  Status post fall from roof approximately 9 feet high. Presenting with hip pain. EXAM: CT ABDOMEN AND PELVIS WITH CONTRAST TECHNIQUE: Multidetector CT imaging of the abdomen and pelvis was performed using the standard protocol following bolus administration of intravenous contrast. CONTRAST:  100mL OMNIPAQUE IOHEXOL 300 MG/ML  SOLN COMPARISON:  None. FINDINGS: CHEST: Ports and Devices: None. Lungs/airways: Bilateral lower lobe subsegmental atelectasis. Nonspecific thin walled cystic lesion within the right upper lobe (5:39). No focal  consolidation. No pulmonary nodule. No pulmonary mass. No definite pulmonary contusion or laceration. No pneumatocele formation. The central airways are patent. Pleura: No pleural effusion. Trace posterior right pneumothorax (5:76). No left pneumothorax. No hemothorax. Lymph Nodes: No mediastinal, hilar, or axillary lymphadenopathy. Mediastinum: No pneumomediastinum. No aortic injury or mediastinal hematoma. The thoracic aorta is normal in caliber. The heart is normal in size. No significant pericardial effusion. The esophagus is unremarkable. The thyroid is unremarkable. Chest Wall / Breasts: No chest wall mass. Musculoskeletal: No sternal fracture. Visualized portions of the scapula demonstrate no acute displaced fracture. Nondisplaced acute fracture of the right posterior fourth through eighth ribs with the right posterior seventh rib minimally displaced (1 mm). Acute nondisplaced fracture of the anterior right third and fourth ribs. No spinal fracture. Visualized portions of the right upper extremity suggestive of an avulsion fracture of the triquetrum. ABDOMEN / PELVIS: Liver: Not enlarged. No focal lesion. No laceration or subcapsular hematoma. Biliary System: The gallbladder is otherwise unremarkable with no radio-opaque gallstones. No biliary ductal dilatation. Pancreas: Normal pancreatic contour. No main pancreatic duct dilatation. Spleen: Not enlarged. No focal lesion. No laceration, subcapsular hematoma, or vascular injury. Adrenal Glands: No nodularity bilaterally. Kidneys: Bilateral kidneys enhance symmetrically. No hydronephrosis. No contusion, laceration, or subcapsular hematoma. No injury to the vascular structures or collecting systems. No hydroureter. On delayed imaging, there is no urothelial wall thickening and there are no filling defects in the opacified portions of the bilateral collecting systems or ureters. The urinary bladder is unremarkable. Bowel: No small or large bowel wall thickening  or dilatation. The appendix is not definitely identified. Mesentery, Omentum, and Peritoneum: No simple free fluid ascites. No pneumoperitoneum. No hemoperitoneum. No mesenteric hematoma identified. No organized fluid collection. Pelvic Organs: Normal. Lymph Nodes: No abdominal, pelvic, inguinal lymphadenopathy. Vasculature: No abdominal aorta or iliac aneurysm. No active contrast extravasation or pseudoaneurysm. Musculoskeletal: No spinal fracture. No acute pelvic fracture; however, partially visualized acute comminuted fracture right greater trochanter extending to the right femoral neck as well as the right proximal femoral shaft. Associated subcutaneus soft tissue edema of the right gluteus medius as well as the right vastus intermedius muscle. IMPRESSION: 1. Trace (sliver) posterior right pneumothorax with associated right anterior third and fourth rib fractures as well as right posterior fourth through eighth ribs fractures. All these fracture are nondisplaced other than the right posterior seventh rib that is minimally displaced (  1mm). 2. Acute comminuted fracture of the right femoral greater trochanter that extends to involve the right femoral neck as well as the right proximal femoral shaft. Femoral shaft fracture only partially visualized. Associated subcutaneus soft tissue edema of the right gluteus medius as well as the right vastus intermedius muscle with intramuscular hematoma not excluded. Recommend dedicated right femur x-rays. 3. Suggestion of an avulsion fracture of the right triquetrum. Recommend dedicated right hand x-rays. 4. No acute intra-abdominal or intrapelvic traumatic injury. 5. No acute fracture or traumatic malalignment of the thoracic or lumbar spine. These results were called by telephone at the time of interpretation on 07/01/2020 at 4:13 pm to provider Dr. Charm Barges, who verbally acknowledged these results. Electronically Signed   By: Tish Frederickson M.D.   On: 07/01/2020 16:14   DG  Hand Complete Right  Result Date: 07/01/2020 CLINICAL DATA:  Recent fall from roof with hand pain, initial encounter EXAM: RIGHT HAND - COMPLETE 3+ VIEW COMPARISON:  None. FINDINGS: Along the posterior aspect of the carpal bones, there are several bony fragments identified likely related to mildly displaced triquetral fractures. No other focal abnormality noted. IMPRESSION: Fragments along the posterior aspect of the carpal bones consistent with triquetral fractures. Electronically Signed   By: Alcide Clever M.D.   On: 07/01/2020 17:28   DG Hip Unilat W or Wo Pelvis 2-3 Views Right  Result Date: 07/01/2020 CLINICAL DATA:  Fall off a roof. Right hip pain. EXAM: DG HIP (WITH OR WITHOUT PELVIS) 2-3V RIGHT COMPARISON:  None. FINDINGS: Mildly comminuted and displaced right proximal femur fracture involving the greater trochanter involving the lateral cortex and vertical extension into the subtrochanteric region. There is no involvement of the femoral neck. Femoral head remains seated. Pubic rami are intact. There is no additional fracture. IMPRESSION: Mildly comminuted and displaced right proximal femur fracture involving the greater trochanter and subtrochanteric region. Electronically Signed   By: Narda Rutherford M.D.   On: 07/01/2020 15:30   CT Maxillofacial Wo Contrast  Result Date: 07/01/2020 CLINICAL DATA:  Facial trauma. Fall with laceration to right cheek. EXAM: CT MAXILLOFACIAL WITHOUT CONTRAST TECHNIQUE: Multidetector CT imaging of the maxillofacial structures was performed. Multiplanar CT image reconstructions were also generated. COMPARISON:  None. FINDINGS: Osseous: No acute fracture of the nasal bone, zygomatic arches, or mandibles. Slight leftward nasal septal deviation. Temporomandibular joints are congruent. The right upper central incisor appears offset and broken. Orbits: Nondisplaced right inferior orbital wall fracture. No extraocular muscle entrapment or globe injury. Left orbit and  globe are intact. Sinuses: Right maxillary sinus fracture with fracture lines involving the posterior and inferior wall as well as the posterolateral wall and roof. Adjacent soft tissue air. Moderate right hemosinus. Small fluid levels in the right side of sphenoid sinus without sphenoid sinus fracture. Mild mucosal thickening of left maxillary and right frontal sinus. Soft tissues: Soft tissue edema overlies the right cheek. No radiopaque foreign body. Limited intracranial: No significant or unexpected finding. IMPRESSION: 1. Nondisplaced right inferior orbital wall fracture. No extraocular muscle entrapment or globe injury. 2. Right maxillary sinus fracture with fracture lines involving the posterior and inferior wall as well as the posterolateral wall and roof. Moderate right hemosinus. 3. The right upper central incisor appears offset and broken. Electronically Signed   By: Narda Rutherford M.D.   On: 07/01/2020 17:05    Imaging: reviewed  A/P: Talmadge L Kaan Tosh. is an 34 y.o. male with s/p fall off of roof Right Comminuted femur/femoral neck fx Right  rib fx 3-8 Right radial neck fx Right  Triquetral fx Right inferior orbit wall/max sinus fx Right upper central incisor broken Elevated transaminases  Admit inpatient [facility] p [facility] for surgery for ortho Friday for femur, NWB with Dr Jena Gauss Sling RUE, removable wrist splint Pain control, pulm toilet scds Consult facial trauma - EDP spoke with Dr Pollyann Kennedy [employer] vte prophylaxis Repeat cmet in am  Mary Sella. Andrey Campanile, MD, FACS General, Bariatric, & Minimally Invasive Surgery [facility], [place]

## 2020-07-01 NOTE — ED Notes (Signed)
Patient transported to CT 

## 2020-07-01 NOTE — Progress Notes (Signed)
Orthopedic Tech Progress Note Patient Details:  Cory Griffin. 1986-05-21 [id number] Left sling bedside. Patient was comfortable and didn't want arm in that position at the moment. Ortho Devices Type of Ortho Device: Velcro wrist splint,Arm sling Ortho Device/Splint Location: RUE Ortho Device/Splint Interventions: Application,Ordered,Adjustment   Post Interventions Patient Tolerated: Well Instructions Provided: Adjustment of device   Jeevan Kalla A [name] 07/01/2020, 7:01 PM

## 2020-07-01 NOTE — ED Triage Notes (Signed)
Pt to the Ed after falling off a roof , approx 9 ft high , gcs 15 . C/o right wrist /elbow,and hip pain, 1 -40 oz beer today

## 2020-07-01 NOTE — ED Provider Notes (Signed)
[facility] Provider Note   CSN: [id number] Arrival date & time: 07/01/20  1355     History Chief Complaint  Patient presents with  . Fall    Trudie Buckler Eugene Isadore. is a 34 y.o. male with no pertinent past medical history, unknown last tetanus who presents today for evaluation after a fall.  He reports that he was about 9 to 10 feet on a roof and was attempting to hold a ladder to help someone get up to the next floor when he walked too far backwards falling off the roof.  He landed on concrete on his right side.  He denies loss of consciousness.  He has been unable to ambulate since.  His primary area of pain is in his right hip along with pain in his right arm.  He does not take any blood thinning medications. He does report that he had two (#2) cans of 24 oz beers this morning at about 10 am.   Level 5 caveat for acuity of condition.  HPI     History reviewed. No pertinent past medical history.  There are no problems to display for this patient.   History reviewed. No pertinent surgical history.     No family history on file.  Social History   Tobacco Use  . Smoking status: Current Every Day Smoker  . Smokeless tobacco: Never Used  Substance Use Topics  . Alcohol use: Yes  . Drug use: No    Home Medications Prior to Admission medications   Not on File    Allergies    Patient has no known allergies.  Review of Systems   Review of Systems  Unable to perform ROS: Acuity of condition    Physical Exam Updated Vital Signs BP 101/89   Pulse 96   Temp (!) 97 F (36.1 C)   Resp 16   Ht 6' 1"  (1.854 m)   Wt 74.4 kg   SpO2 98%   BMI 21.64 kg/m   Physical Exam Vitals and nursing note reviewed.  Constitutional:      General: He is not in acute distress.    Appearance: He is not diaphoretic.     Interventions: Cervical collar in place.  HENT:     Head:     Comments: No otorrhea bilaterally.  There is obvious edema  over the right lateral brow ridge and temple.  There is a superficial abrasion over the right maxilla.  This area is all very tender to touch.  Full EOMs bilaterally.   Medial incisor displacement acute per patient    Mouth/Throat:     Mouth: Mucous membranes are moist.  Eyes:     General: No scleral icterus.       Right eye: No discharge.        Left eye: No discharge.     Extraocular Movements: Extraocular movements intact.     Conjunctiva/sclera: Conjunctivae normal.     Pupils: Pupils are equal, round, and reactive to light.  Neck:     Comments: C-collar in place.  Trachea is midline normal phonation. Cardiovascular:     Rate and Rhythm: Regular rhythm. Tachycardia present.     Pulses: Normal pulses.          Radial pulses are 2+ on the right side and 2+ on the left side.       Dorsalis pedis pulses are 2+ on the right side and 2+ on the left side.  Posterior tibial pulses are 2+ on the right side and 2+ on the left side.     Heart sounds: Normal heart sounds. No murmur heard.   Pulmonary:     Effort: Pulmonary effort is normal. No respiratory distress.     Breath sounds: Normal breath sounds. No stridor. No decreased breath sounds.  Chest:     Chest wall: Swelling, tenderness (Right sided lateral chest edema and contusion with tenderness.) and edema present.       Comments: Chest movement is symmetric without paradoxical movement.  Bilateral clavicles are grossly intact Abdominal:     General: There is no distension.     Tenderness: There is no abdominal tenderness. There is no guarding.  Musculoskeletal:        General: No deformity.     Right lower leg: No edema.     Left lower leg: No edema.     Comments: Diffuse tenderness over right arm primarily in her right hand/wrist, elbow and right lateral shoulder.  There is obvious tenderness with any movement of the right hip.  Patient is able to dorsiflex and plantar flex bilaterally.    Skin:    General: Skin is warm  and dry.     Comments: Superficial abrasions visualized over the right sided face, right lateral shoulder.  Neurological:     Mental Status: He is alert.     Motor: No abnormal muscle tone.     Comments: Patient is awake and alert, sensation intact to light touch to bilateral upper and lower extremities.  Speech is not slurred.  Psychiatric:     Comments: Irritable     ED Results / Procedures / Treatments   Labs (all labs ordered are listed, but only abnormal results are displayed) Labs Reviewed  COMPREHENSIVE METABOLIC PANEL - Abnormal; Notable for the following components:      Result Value   Sodium 133 (*)    Glucose, Bld 119 (*)    Calcium 8.8 (*)    Total Protein 6.2 (*)    AST 61 (*)    ALT 59 (*)    Total Bilirubin 1.3 (*)    All other components within normal limits  CBC WITH DIFFERENTIAL/PLATELET - Abnormal; Notable for the following components:   WBC 14.8 (*)    MCV 101.4 (*)    Neutro Abs 12.8 (*)    All other components within normal limits  I-STAT CHEM 8, ED - Abnormal; Notable for the following components:   Creatinine, Ser 0.60 (*)    Glucose, Bld 120 (*)    Calcium, Ion 1.05 (*)    All other components within normal limits  RESP PANEL BY RT-PCR (FLU A&B, COVID) ARPGX2  ETHANOL  LACTIC ACID, PLASMA  PROTIME-INR  URINALYSIS, ROUTINE W REFLEX MICROSCOPIC  SAMPLE TO BLOOD BANK    EKG None  Radiology DG Chest 1 View  Result Date: 07/01/2020 CLINICAL DATA:  Fall from roof EXAM: CHEST  1 VIEW COMPARISON:  CT 07/01/2020 FINDINGS: The heart size and mediastinal contours are within normal limits. Both lungs are clear. The visualized skeletal structures are unremarkable. Nondisplaced fractures identified on companion CT involving the posterior RIGHT lower ribs are not evident by plain film. IMPRESSION: 1. Posterior RIGHT rib fracture identified on companion CT are not clearly evident. 2. No pneumothorax. Electronically Signed   By: Suzy Bouchard M.D.   On:  07/01/2020 16:08   DG Shoulder Right  Result Date: 07/01/2020 CLINICAL DATA:  34 year old male with fall  and right shoulder pain. EXAM: RIGHT SHOULDER - 2+ VIEW COMPARISON:  None. FINDINGS: There is no acute fracture or dislocation of the right shoulder. There are fractures of the posterior as well as anterior right seventh rib. Dedicated rib series may provide better evaluation. Probable mild subcutaneous contusion lateral to the shoulder. No radiopaque foreign object or soft tissue gas. IMPRESSION: 1. No acute fracture or dislocation of the right shoulder. 2. Fractures of the posterior and anterior right seventh rib. Dedicated rib series may provide better evaluation. Electronically Signed   By: Anner Crete M.D.   On: 07/01/2020 15:32   DG Elbow 2 Views Right  Result Date: 07/01/2020 CLINICAL DATA:  Fall from roof with elbow pain, initial encounter EXAM: RIGHT ELBOW - 1 VIEW COMPARISON:  None. FINDINGS: Transverse fracture is noted through the radial neck just below the radial head with elevation of the anterior and posterior fat pads consistent with joint effusion. No other fracture is noted. IMPRESSION: Proximal radial fracture involving the neck as described. Electronically Signed   By: Inez Catalina M.D.   On: 07/01/2020 17:26   DG Forearm Right  Result Date: 07/01/2020 CLINICAL DATA:  Fall from roof with forearm pain, initial encounter EXAM: RIGHT FOREARM - 2 VIEW COMPARISON:  None. FINDINGS: There is a fracture through the radial neck with only minimal displacement identified. No other focal abnormality is noted. IMPRESSION: Proximal radius fracture involving the neck incompletely evaluated on this exam. Electronically Signed   By: Inez Catalina M.D.   On: 07/01/2020 17:25   DG Wrist Complete Right  Result Date: 07/01/2020 CLINICAL DATA:  Fall off roof with right wrist pain. EXAM: RIGHT WRIST - COMPLETE 3+ VIEW COMPARISON:  None. FINDINGS: There is no evidence of fracture or  dislocation. There is no evidence of arthropathy or other focal bone abnormality. Soft tissues are unremarkable. IMPRESSION: Negative radiographs of the right wrist. Electronically Signed   By: Keith Rake M.D.   On: 07/01/2020 15:31   CT Head Wo Contrast  Result Date: 07/01/2020 CLINICAL DATA:  Head trauma, moderate/severe. Neck trauma, dangerous injury mechanism. Additional history provided: Fall off of roof. EXAM: CT HEAD WITHOUT CONTRAST CT CERVICAL SPINE WITHOUT CONTRAST TECHNIQUE: Multidetector CT imaging of the head and cervical spine was performed following the standard protocol without intravenous contrast. Multiplanar CT image reconstructions of the cervical spine were also generated. COMPARISON:  No pertinent prior exams available for comparison. FINDINGS: CT HEAD FINDINGS Brain: Cerebral volume is normal. There is no acute intracranial hemorrhage. No demarcated cortical infarct. No extra-axial fluid collection. No evidence of intracranial mass. No midline shift. Vascular: No hyperdense vessel. Skull: Deformity of the right sphenoid bone along the lateral aspect of the right orbital apex, likely reflecting a mildly displaced acute fracture (for instance as seen on series 4, image 31) (series 5, image 25). Sinuses/Orbits: Subtle irregularity of the right orbital floor is questioned and a nondisplaced fracture is difficult to exclude. Right periorbital and maxillofacial soft tissue swelling. Acute, mildly displaced fractures of the posterolateral wall of the right maxillary sinus. Air-fluid level within the right maxillary sinus likely reflecting the presence of hemorrhage. Small volume frothy secretions, small air-fluid level and small mucous retention cyst within the right sphenoid sinus. Mild paranasal sinus mucosal thickening elsewhere. Other: Nondisplaced acute fracture of the right maxillary hard palate (for instance as seen on series 4, image 12). CT CERVICAL SPINE FINDINGS Alignment: Mild  nonspecific reversal of the expected cervical lordosis. No significant spondylolisthesis. Skull base  and vertebrae: The basion-dental and atlanto-dental intervals are maintained.No evidence of acute fracture to the cervical spine. Soft tissues and spinal canal: No prevertebral fluid or swelling. No visible canal hematoma. Disc levels: No significant bony spinal canal or neural foraminal narrowing at any level. Upper chest: No visible airspace consolidation or pneumothorax. IMPRESSION: CT head: 1. No evidence of acute intracranial abnormality. 2. Mildly displaced fracture of the right sphenoid bone along the lateral aspect of the right orbital apex. 3. Acute, mildly displaced fractures of the posterolateral wall of the right maxillary sinus. Nondisplaced acute fracture through the right maxillary hard palate. Subtle irregularity of the right orbital floor is also questioned and a nondisplaced right orbital floor fracture is difficult to exclude. A dedicated maxillofacial CT is recommended for further evaluation of these maxillofacial and skull base fractures. 4. Large air-fluid level within the right maxillary sinus likely reflecting the presence of blood products. 5. A small air-fluid level is also present within the right sphenoid sinus, which may reflect secretions or blood products. 6. Right periorbital/maxillofacial hematoma. CT cervical spine: 1. No evidence of acute fracture to the cervical spine. 2. Mild nonspecific reversal of the expected cervical lordosis. Electronically Signed   By: Kellie Simmering DO   On: 07/01/2020 16:15   CT CHEST W CONTRAST  Result Date: 07/01/2020 CLINICAL DATA:  Status post fall from roof approximately 9 feet high. Presenting with hip pain. EXAM: CT ABDOMEN AND PELVIS WITH CONTRAST TECHNIQUE: Multidetector CT imaging of the abdomen and pelvis was performed using the standard protocol following bolus administration of intravenous contrast. CONTRAST:  176m OMNIPAQUE IOHEXOL 300  MG/ML  SOLN COMPARISON:  None. FINDINGS: CHEST: Ports and Devices: None. Lungs/airways: Bilateral lower lobe subsegmental atelectasis. Nonspecific thin walled cystic lesion within the right upper lobe (5:39). No focal consolidation. No pulmonary nodule. No pulmonary mass. No definite pulmonary contusion or laceration. No pneumatocele formation. The central airways are patent. Pleura: No pleural effusion. Trace posterior right pneumothorax (5:76). No left pneumothorax. No hemothorax. Lymph Nodes: No mediastinal, hilar, or axillary lymphadenopathy. Mediastinum: No pneumomediastinum. No aortic injury or mediastinal hematoma. The thoracic aorta is normal in caliber. The heart is normal in size. No significant pericardial effusion. The esophagus is unremarkable. The thyroid is unremarkable. Chest Wall / Breasts: No chest wall mass. Musculoskeletal: No sternal fracture. Visualized portions of the scapula demonstrate no acute displaced fracture. Nondisplaced acute fracture of the right posterior fourth through eighth ribs with the right posterior seventh rib minimally displaced (1 mm). Acute nondisplaced fracture of the anterior right third and fourth ribs. No spinal fracture. Visualized portions of the right upper extremity suggestive of an avulsion fracture of the triquetrum. ABDOMEN / PELVIS: Liver: Not enlarged. No focal lesion. No laceration or subcapsular hematoma. Biliary System: The gallbladder is otherwise unremarkable with no radio-opaque gallstones. No biliary ductal dilatation. Pancreas: Normal pancreatic contour. No main pancreatic duct dilatation. Spleen: Not enlarged. No focal lesion. No laceration, subcapsular hematoma, or vascular injury. Adrenal Glands: No nodularity bilaterally. Kidneys: Bilateral kidneys enhance symmetrically. No hydronephrosis. No contusion, laceration, or subcapsular hematoma. No injury to the vascular structures or collecting systems. No hydroureter. On delayed imaging, there is no  urothelial wall thickening and there are no filling defects in the opacified portions of the bilateral collecting systems or ureters. The urinary bladder is unremarkable. Bowel: No small or large bowel wall thickening or dilatation. The appendix is not definitely identified. Mesentery, Omentum, and Peritoneum: No simple free fluid ascites. No pneumoperitoneum. No hemoperitoneum. No  mesenteric hematoma identified. No organized fluid collection. Pelvic Organs: Normal. Lymph Nodes: No abdominal, pelvic, inguinal lymphadenopathy. Vasculature: No abdominal aorta or iliac aneurysm. No active contrast extravasation or pseudoaneurysm. Musculoskeletal: No spinal fracture. No acute pelvic fracture; however, partially visualized acute comminuted fracture right greater trochanter extending to the right femoral neck as well as the right proximal femoral shaft. Associated subcutaneus soft tissue edema of the right gluteus medius as well as the right vastus intermedius muscle. IMPRESSION: 1. Trace (sliver) posterior right pneumothorax with associated right anterior third and fourth rib fractures as well as right posterior fourth through eighth ribs fractures. All these fracture are nondisplaced other than the right posterior seventh rib that is minimally displaced (69m). 2. Acute comminuted fracture of the right femoral greater trochanter that extends to involve the right femoral neck as well as the right proximal femoral shaft. Femoral shaft fracture only partially visualized. Associated subcutaneus soft tissue edema of the right gluteus medius as well as the right vastus intermedius muscle with intramuscular hematoma not excluded. Recommend dedicated right femur x-rays. 3. Suggestion of an avulsion fracture of the right triquetrum. Recommend dedicated right hand x-rays. 4. No acute intra-abdominal or intrapelvic traumatic injury. 5. No acute fracture or traumatic malalignment of the thoracic or lumbar spine. These results were  called by telephone at the time of interpretation on 07/01/2020 at 4:13 pm to provider Dr. BMelina Copa who verbally acknowledged these results. Electronically Signed   By: MIven FinnM.D.   On: 07/01/2020 16:14   CT Cervical Spine Wo Contrast  Result Date: 07/01/2020 CLINICAL DATA:  Head trauma, moderate/severe. Neck trauma, dangerous injury mechanism. Additional history provided: Fall off of roof. EXAM: CT HEAD WITHOUT CONTRAST CT CERVICAL SPINE WITHOUT CONTRAST TECHNIQUE: Multidetector CT imaging of the head and cervical spine was performed following the standard protocol without intravenous contrast. Multiplanar CT image reconstructions of the cervical spine were also generated. COMPARISON:  No pertinent prior exams available for comparison. FINDINGS: CT HEAD FINDINGS Brain: Cerebral volume is normal. There is no acute intracranial hemorrhage. No demarcated cortical infarct. No extra-axial fluid collection. No evidence of intracranial mass. No midline shift. Vascular: No hyperdense vessel. Skull: Deformity of the right sphenoid bone along the lateral aspect of the right orbital apex, likely reflecting a mildly displaced acute fracture (for instance as seen on series 4, image 31) (series 5, image 25). Sinuses/Orbits: Subtle irregularity of the right orbital floor is questioned and a nondisplaced fracture is difficult to exclude. Right periorbital and maxillofacial soft tissue swelling. Acute, mildly displaced fractures of the posterolateral wall of the right maxillary sinus. Air-fluid level within the right maxillary sinus likely reflecting the presence of hemorrhage. Small volume frothy secretions, small air-fluid level and small mucous retention cyst within the right sphenoid sinus. Mild paranasal sinus mucosal thickening elsewhere. Other: Nondisplaced acute fracture of the right maxillary hard palate (for instance as seen on series 4, image 12). CT CERVICAL SPINE FINDINGS Alignment: Mild nonspecific  reversal of the expected cervical lordosis. No significant spondylolisthesis. Skull base and vertebrae: The basion-dental and atlanto-dental intervals are maintained.No evidence of acute fracture to the cervical spine. Soft tissues and spinal canal: No prevertebral fluid or swelling. No visible canal hematoma. Disc levels: No significant bony spinal canal or neural foraminal narrowing at any level. Upper chest: No visible airspace consolidation or pneumothorax. IMPRESSION: CT head: 1. No evidence of acute intracranial abnormality. 2. Mildly displaced fracture of the right sphenoid bone along the lateral aspect of the right orbital apex.  3. Acute, mildly displaced fractures of the posterolateral wall of the right maxillary sinus. Nondisplaced acute fracture through the right maxillary hard palate. Subtle irregularity of the right orbital floor is also questioned and a nondisplaced right orbital floor fracture is difficult to exclude. A dedicated maxillofacial CT is recommended for further evaluation of these maxillofacial and skull base fractures. 4. Large air-fluid level within the right maxillary sinus likely reflecting the presence of blood products. 5. A small air-fluid level is also present within the right sphenoid sinus, which may reflect secretions or blood products. 6. Right periorbital/maxillofacial hematoma. CT cervical spine: 1. No evidence of acute fracture to the cervical spine. 2. Mild nonspecific reversal of the expected cervical lordosis. Electronically Signed   By: Kellie Simmering DO   On: 07/01/2020 16:15   CT ABDOMEN PELVIS W CONTRAST  Result Date: 07/01/2020 CLINICAL DATA:  Status post fall from roof approximately 9 feet high. Presenting with hip pain. EXAM: CT ABDOMEN AND PELVIS WITH CONTRAST TECHNIQUE: Multidetector CT imaging of the abdomen and pelvis was performed using the standard protocol following bolus administration of intravenous contrast. CONTRAST:  155m OMNIPAQUE IOHEXOL 300 MG/ML   SOLN COMPARISON:  None. FINDINGS: CHEST: Ports and Devices: None. Lungs/airways: Bilateral lower lobe subsegmental atelectasis. Nonspecific thin walled cystic lesion within the right upper lobe (5:39). No focal consolidation. No pulmonary nodule. No pulmonary mass. No definite pulmonary contusion or laceration. No pneumatocele formation. The central airways are patent. Pleura: No pleural effusion. Trace posterior right pneumothorax (5:76). No left pneumothorax. No hemothorax. Lymph Nodes: No mediastinal, hilar, or axillary lymphadenopathy. Mediastinum: No pneumomediastinum. No aortic injury or mediastinal hematoma. The thoracic aorta is normal in caliber. The heart is normal in size. No significant pericardial effusion. The esophagus is unremarkable. The thyroid is unremarkable. Chest Wall / Breasts: No chest wall mass. Musculoskeletal: No sternal fracture. Visualized portions of the scapula demonstrate no acute displaced fracture. Nondisplaced acute fracture of the right posterior fourth through eighth ribs with the right posterior seventh rib minimally displaced (1 mm). Acute nondisplaced fracture of the anterior right third and fourth ribs. No spinal fracture. Visualized portions of the right upper extremity suggestive of an avulsion fracture of the triquetrum. ABDOMEN / PELVIS: Liver: Not enlarged. No focal lesion. No laceration or subcapsular hematoma. Biliary System: The gallbladder is otherwise unremarkable with no radio-opaque gallstones. No biliary ductal dilatation. Pancreas: Normal pancreatic contour. No main pancreatic duct dilatation. Spleen: Not enlarged. No focal lesion. No laceration, subcapsular hematoma, or vascular injury. Adrenal Glands: No nodularity bilaterally. Kidneys: Bilateral kidneys enhance symmetrically. No hydronephrosis. No contusion, laceration, or subcapsular hematoma. No injury to the vascular structures or collecting systems. No hydroureter. On delayed imaging, there is no  urothelial wall thickening and there are no filling defects in the opacified portions of the bilateral collecting systems or ureters. The urinary bladder is unremarkable. Bowel: No small or large bowel wall thickening or dilatation. The appendix is not definitely identified. Mesentery, Omentum, and Peritoneum: No simple free fluid ascites. No pneumoperitoneum. No hemoperitoneum. No mesenteric hematoma identified. No organized fluid collection. Pelvic Organs: Normal. Lymph Nodes: No abdominal, pelvic, inguinal lymphadenopathy. Vasculature: No abdominal aorta or iliac aneurysm. No active contrast extravasation or pseudoaneurysm. Musculoskeletal: No spinal fracture. No acute pelvic fracture; however, partially visualized acute comminuted fracture right greater trochanter extending to the right femoral neck as well as the right proximal femoral shaft. Associated subcutaneus soft tissue edema of the right gluteus medius as well as the right vastus intermedius muscle. IMPRESSION:  1. Trace (sliver) posterior right pneumothorax with associated right anterior third and fourth rib fractures as well as right posterior fourth through eighth ribs fractures. All these fracture are nondisplaced other than the right posterior seventh rib that is minimally displaced (73m). 2. Acute comminuted fracture of the right femoral greater trochanter that extends to involve the right femoral neck as well as the right proximal femoral shaft. Femoral shaft fracture only partially visualized. Associated subcutaneus soft tissue edema of the right gluteus medius as well as the right vastus intermedius muscle with intramuscular hematoma not excluded. Recommend dedicated right femur x-rays. 3. Suggestion of an avulsion fracture of the right triquetrum. Recommend dedicated right hand x-rays. 4. No acute intra-abdominal or intrapelvic traumatic injury. 5. No acute fracture or traumatic malalignment of the thoracic or lumbar spine. These results were  called by telephone at the time of interpretation on 07/01/2020 at 4:13 pm to provider Dr. BMelina Copa who verbally acknowledged these results. Electronically Signed   By: MIven FinnM.D.   On: 07/01/2020 16:14   DG Hand Complete Right  Result Date: 07/01/2020 CLINICAL DATA:  Recent fall from roof with hand pain, initial encounter EXAM: RIGHT HAND - COMPLETE 3+ VIEW COMPARISON:  None. FINDINGS: Along the posterior aspect of the carpal bones, there are several bony fragments identified likely related to mildly displaced triquetral fractures. No other focal abnormality noted. IMPRESSION: Fragments along the posterior aspect of the carpal bones consistent with triquetral fractures. Electronically Signed   By: MInez CatalinaM.D.   On: 07/01/2020 17:28   DG Hip Unilat W or Wo Pelvis 2-3 Views Right  Result Date: 07/01/2020 CLINICAL DATA:  Fall off a roof. Right hip pain. EXAM: DG HIP (WITH OR WITHOUT PELVIS) 2-3V RIGHT COMPARISON:  None. FINDINGS: Mildly comminuted and displaced right proximal femur fracture involving the greater trochanter involving the lateral cortex and vertical extension into the subtrochanteric region. There is no involvement of the femoral neck. Femoral head remains seated. Pubic rami are intact. There is no additional fracture. IMPRESSION: Mildly comminuted and displaced right proximal femur fracture involving the greater trochanter and subtrochanteric region. Electronically Signed   By: MKeith RakeM.D.   On: 07/01/2020 15:30   CT Maxillofacial Wo Contrast  Result Date: 07/01/2020 CLINICAL DATA:  Facial trauma. Fall with laceration to right cheek. EXAM: CT MAXILLOFACIAL WITHOUT CONTRAST TECHNIQUE: Multidetector CT imaging of the maxillofacial structures was performed. Multiplanar CT image reconstructions were also generated. COMPARISON:  None. FINDINGS: Osseous: No acute fracture of the nasal bone, zygomatic arches, or mandibles. Slight leftward nasal septal deviation.  Temporomandibular joints are congruent. The right upper central incisor appears offset and broken. Orbits: Nondisplaced right inferior orbital wall fracture. No extraocular muscle entrapment or globe injury. Left orbit and globe are intact. Sinuses: Right maxillary sinus fracture with fracture lines involving the posterior and inferior wall as well as the posterolateral wall and roof. Adjacent soft tissue air. Moderate right hemosinus. Small fluid levels in the right side of sphenoid sinus without sphenoid sinus fracture. Mild mucosal thickening of left maxillary and right frontal sinus. Soft tissues: Soft tissue edema overlies the right cheek. No radiopaque foreign body. Limited intracranial: No significant or unexpected finding. IMPRESSION: 1. Nondisplaced right inferior orbital wall fracture. No extraocular muscle entrapment or globe injury. 2. Right maxillary sinus fracture with fracture lines involving the posterior and inferior wall as well as the posterolateral wall and roof. Moderate right hemosinus. 3. The right upper central incisor appears offset and broken. Electronically  Signed   By: Keith Rake M.D.   On: 07/01/2020 17:05    Procedures .Critical Care Performed by: Lorin Glass, PA-C Authorized by: Lorin Glass, PA-C   Critical care provider statement:    Critical care time (minutes):  45   Critical care time was exclusive of:  Separately billable procedures and treating other patients   Critical care was time spent personally by me on the following activities:  Discussions with consultants, evaluation of patient's response to treatment, examination of patient, ordering and performing treatments and interventions, ordering and review of laboratory studies, ordering and review of radiographic studies, pulse oximetry, re-evaluation of patient's condition, obtaining history from patient or surrogate and review of old charts   (including critical care time)  Medications  Ordered in ED Medications  morphine 4 MG/ML injection 4 mg (4 mg Intravenous Given 07/01/20 1807)  0.9 %  sodium chloride infusion (has no administration in time range)  fentaNYL (SUBLIMAZE) injection 50 mcg (50 mcg Intravenous Given 07/01/20 1611)  iohexol (OMNIPAQUE) 300 MG/ML solution 100 mL (100 mLs Intravenous Contrast Given 07/01/20 1539)  Tdap (BOOSTRIX) injection 0.5 mL (0.5 mLs Intramuscular Given 07/01/20 1610)  lactated ringers infusion ( Intravenous Stopped 07/01/20 1722)    ED Course  I have reviewed the triage vital signs and the nursing notes.  Pertinent labs & imaging results that were available during my care of the patient were reviewed by me and considered in my medical decision making (see chart for details).  Clinical Course as of 07/01/20 1848  Thu Jul 01, 2020  1431 Patient not in room [BM]  1530 Patient meets trauma criteria with hip fracture.  I informed charge RN and asked for it to be paged out.  Primary RN is aware that patient meets level two trauma criteria (fall from higher than ground level with stable femur fracture at this time)  I went to see patient and he was not in room.  Found patient in x-ray/CT scan.  [EH]  2694 Based on mechanism patient met trauma criteria with x-rays showing hip fracture.  He did not initially meet criteria.  I found patient, he is currently in CT scans and x-rays.  He is awake and alert.  [EH]  1640 I spoke with Dr. Redmond Pulling who will see patient.  [EH]  1640 I spoke with Dr. Doreatha Martin regarding the RUE results, he will review, order splint if needed and if he feels its needed contact hand.  [EH]  1644 I spoke with Dr. Doreatha Martin who will see patient, plan for surgery tomorrow.  [EH]  1864 34 year old male here after fall 9 foot off a roof.  Complaining of of extremity and hip pain, chest wall pain.  Distal pulses and motor intact.  Has comminuted right hip along with rib fractures tiny pneumo facial fractures.  Orthopedics and ENT consult  along with trauma to evaluate for admission. [MB]    Clinical Course User Index [BM] Deliah Boston, PA-C [EH] Lorin Glass, PA-C [MB] Hayden Rasmussen, MD   MDM Rules/Calculators/A&P                         Patient is a 34 year old man who presents today for evaluation after a fall from about 9 to 10 feet landing on the right side.  Patient was not activated as a trauma initially.  When I first went to see patient he was in imaging.  I viewed these images and  saw concern for a right-sided femur fracture.  Given that this was more than a ground-level fall with a femur fracture he meets at minimum level 2 trauma criteria.  I activated him as a level 2 trauma. My initial evaluation was in imaging suite. Orders were placed for CT head and neck, right hip x-ray is and x-rays of the right upper extremity.  Additionally I added CT chest abdomen pelvis.  Patient was initially slightly tachycardic in the low 100s, he is given 1 L LR and pain medication.  He did not become hypotensive at any point while under my care and his tachycardia improved with IV fluids.  On physical exam he has a obvious contusion of the right sided chest despite not having significant pain there unless he takes a full deep breath.  CT scans of his chest show multiple right rib fractures including anterior fractures of ribs 3, 4, and posterior fractures of ribs 4 through 8 with a small right-sided pneumothorax.  Patient is not hypoxic, no evidence of tension pneumo.  In addition to his right-sided femur fracture he also has a triquetral fracture on the right wrist, a right proximal radius fracture and multiple fractures of the right sided maxillary sinus and right orbital wall/no evidence of entrapment with full EOMs bilaterally without reported vision change.  Tdap is updated.  He has superficial abrasion on his face and shoulder without need for primary repair.  His pain was treated in the emergency room with  fentanyl and morphine.  I spoke with Dr. Doreatha Martin of orthopedics regarding patient's multiple fractures, he will go to OR tomorrow for repair of his right femur.  I spoke with Dr. Redmond Pulling, on-call for trauma who will admit the patient.  Additionally I spoke with Dr. Constance Holster of ENT who will consult on the patient.  I discussed all of patient's results with him, and he states his understanding.  Note: Portions of this report may have been transcribed using voice recognition software. Every effort was made to ensure accuracy; however, inadvertent computerized transcription errors may be present  Final Clinical Impression(s) / ED Diagnoses Final diagnoses:  Fall, initial encounter  Closed fracture of multiple ribs of right side, initial encounter  Closed displaced fracture of triquetrum of right wrist, initial encounter  Fracture, proximal femur, right, closed, initial encounter ([place])  Closed fracture of orbital wall, initial encounter ([place])  Abrasion  Need for tetanus booster  Closed fracture of maxillary sinus, initial encounter [facility])  Traumatic pneumothorax, initial encounter  Closed fracture of proximal end of left radius, unspecified fracture morphology, initial encounter  Trauma    Rx / DC Orders ED Discharge Orders    None       Ollen Gross 07/01/20 2232    Hayden Rasmussen, MD 07/02/20 1141

## 2020-07-02 ENCOUNTER — Inpatient Hospital Stay (HOSPITAL_COMMUNITY): Payer: Medicaid Other | Admitting: Certified Registered"

## 2020-07-02 ENCOUNTER — Inpatient Hospital Stay (HOSPITAL_COMMUNITY): Payer: Medicaid Other

## 2020-07-02 ENCOUNTER — Encounter (HOSPITAL_COMMUNITY): Admission: EM | Disposition: A | Discharge status: Home / Self Care | Payer: Self-pay | Source: Home / Self Care

## 2020-07-02 HISTORY — PX: INTRAMEDULLARY (IM) NAIL INTERTROCHANTERIC: SHX5875

## 2020-07-02 LAB — COMPREHENSIVE METABOLIC PANEL
ALT: 47 U/L — ABNORMAL HIGH (ref 0–44)
AST: 44 U/L — ABNORMAL HIGH (ref 15–41)
Albumin: 3.7 g/dL (ref 3.5–5.0)
Alkaline Phosphatase: 44 U/L (ref 38–126)
Anion gap: 10 (ref 5–15)
BUN: 5 mg/dL — ABNORMAL LOW (ref 6–20)
CO2: 22 mmol/L (ref 22–32)
Calcium: 8.6 mg/dL — ABNORMAL LOW (ref 8.9–10.3)
Chloride: 102 mmol/L (ref 98–111)
Creatinine, Ser: 0.81 mg/dL (ref 0.61–1.24)
GFR, Estimated: >60 mL/min (ref >60)
Glucose, Bld: 117 mg/dL — ABNORMAL HIGH (ref 70–99)
Potassium: 4 mmol/L (ref 3.5–5.1)
Sodium: 134 mmol/L — ABNORMAL LOW (ref 135–145)
Total Bilirubin: 1.6 mg/dL — ABNORMAL HIGH (ref 0.3–1.2)
Total Protein: 5.7 g/dL — ABNORMAL LOW (ref 6.5–8.1)

## 2020-07-02 LAB — CBC
HCT: 38.9 % — ABNORMAL LOW (ref 39.0–52.0)
Hemoglobin: 13 g/dL (ref 13.0–17.0)
MCH: 34.1 pg — ABNORMAL HIGH (ref 26.0–34.0)
MCHC: 33.4 g/dL (ref 30.0–36.0)
MCV: 102.1 fL — ABNORMAL HIGH (ref 80.0–100.0)
Platelets: 162 10*3/uL (ref 150–400)
RBC: 3.81 MIL/uL — ABNORMAL LOW (ref 4.22–5.81)
RDW: 11.4 % — ABNORMAL LOW (ref 11.5–15.5)
WBC: 10.4 10*3/uL (ref 4.0–10.5)
nRBC: 0 % (ref 0.0–0.2)

## 2020-07-02 LAB — HIV ANTIBODY (ROUTINE TESTING W REFLEX): HIV Screen 4th Generation wRfx: NONREACTIVE

## 2020-07-02 SURGERY — FIXATION, FRACTURE, INTERTROCHANTERIC, WITH INTRAMEDULLARY ROD
Anesthesia: General | Site: Leg Upper | Laterality: Right

## 2020-07-02 MED ORDER — ROCURONIUM BROMIDE 10 MG/ML (PF) SYRINGE
PREFILLED_SYRINGE | INTRAVENOUS | Status: DC | PRN
  Administered 2020-07-02: 50 mg via INTRAVENOUS

## 2020-07-02 MED ORDER — ALBUTEROL SULFATE HFA 108 (90 BASE) MCG/ACT IN AERS
INHALATION_SPRAY | RESPIRATORY_TRACT | Status: DC | PRN
  Administered 2020-07-02 (×4): 2 via RESPIRATORY_TRACT

## 2020-07-02 MED ORDER — SUGAMMADEX SODIUM 200 MG/2ML IV SOLN
INTRAVENOUS | Status: DC | PRN
  Administered 2020-07-02: 200 mg via INTRAVENOUS

## 2020-07-02 MED ORDER — LIDOCAINE 2% (20 MG/ML) 5 ML SYRINGE
INTRAMUSCULAR | Status: DC | PRN
  Administered 2020-07-02: 100 mg via INTRAVENOUS

## 2020-07-02 MED ORDER — HYDROMORPHONE HCL 1 MG/ML IJ SOLN: 0.2500 mg | INTRAMUSCULAR | Status: DC | PRN

## 2020-07-02 MED ORDER — LACTATED RINGERS IV SOLN: INTRAVENOUS | Status: DC | PRN

## 2020-07-02 MED ORDER — CEFAZOLIN SODIUM-DEXTROSE 2-4 GM/100ML-% IV SOLN
2.0000 g | INTRAVENOUS | Status: AC
  Administered 2020-07-02: 11:00:00 2 g via INTRAVENOUS

## 2020-07-02 MED ORDER — ALBUMIN HUMAN 5 % IV SOLN: INTRAVENOUS | Status: DC | PRN

## 2020-07-02 MED ORDER — FENTANYL CITRATE (PF) 250 MCG/5ML IJ SOLN
INTRAMUSCULAR | Status: AC
  Filled 2020-07-02: qty 5

## 2020-07-02 MED ORDER — ESMOLOL HCL 100 MG/10ML IV SOLN
INTRAVENOUS | Status: DC | PRN
  Administered 2020-07-02: 20 mg via INTRAVENOUS

## 2020-07-02 MED ORDER — PHENYLEPHRINE HCL-NACL 10-0.9 MG/250ML-% IV SOLN
INTRAVENOUS | Status: DC | PRN
  Administered 2020-07-02: 50 ug/min via INTRAVENOUS

## 2020-07-02 MED ORDER — POLYETHYLENE GLYCOL 3350 17 G PO PACK
17.0000 g | PACK | Freq: Every day | ORAL | Status: DC
  Administered 2020-07-03: 10:00:00 17 g via ORAL
  Filled 2020-07-02 (×2): qty 1

## 2020-07-02 MED ORDER — DEXAMETHASONE SODIUM PHOSPHATE 10 MG/ML IJ SOLN
INTRAMUSCULAR | Status: DC | PRN
  Administered 2020-07-02: 10 mg via INTRAVENOUS

## 2020-07-02 MED ORDER — PROPOFOL 10 MG/ML IV BOLUS
INTRAVENOUS | Status: AC
  Filled 2020-07-02: qty 20

## 2020-07-02 MED ORDER — POVIDONE-IODINE 10 % EX SWAB: 2.0000 "application " | Freq: Once | CUTANEOUS | Status: DC

## 2020-07-02 MED ORDER — CHLORHEXIDINE GLUCONATE 4 % EX LIQD: 60.0000 mL | Freq: Once | CUTANEOUS | Status: DC

## 2020-07-02 MED ORDER — FENTANYL CITRATE (PF) 250 MCG/5ML IJ SOLN
INTRAMUSCULAR | Status: DC | PRN
  Administered 2020-07-02 (×2): 50 ug via INTRAVENOUS
  Administered 2020-07-02: 100 ug via INTRAVENOUS
  Administered 2020-07-02 (×2): 50 ug via INTRAVENOUS

## 2020-07-02 MED ORDER — PROMETHAZINE HCL 25 MG/ML IJ SOLN: 6.2500 mg | INTRAMUSCULAR | Status: DC | PRN

## 2020-07-02 MED ORDER — MIDAZOLAM HCL 2 MG/2ML IJ SOLN
INTRAMUSCULAR | Status: AC
  Filled 2020-07-02: qty 2

## 2020-07-02 MED ORDER — OXYCODONE HCL 5 MG PO TABS: 5.0000 mg | ORAL_TABLET | Freq: Once | ORAL | Status: DC | PRN

## 2020-07-02 MED ORDER — VANCOMYCIN HCL 1000 MG IV SOLR
INTRAVENOUS | Status: AC
  Filled 2020-07-02: qty 1000

## 2020-07-02 MED ORDER — ENOXAPARIN SODIUM 30 MG/0.3ML ~~LOC~~ SOLN
30.0000 mg | Freq: Two times a day (BID) | SUBCUTANEOUS | Status: DC
  Filled 2020-07-02 (×2): qty 0.3

## 2020-07-02 MED ORDER — CEFAZOLIN SODIUM-DEXTROSE 2-4 GM/100ML-% IV SOLN
2.0000 g | Freq: Three times a day (TID) | INTRAVENOUS | Status: AC
  Administered 2020-07-02 – 2020-07-03 (×2): 2 g via INTRAVENOUS
  Filled 2020-07-02 (×4): qty 100

## 2020-07-02 MED ORDER — VANCOMYCIN HCL 1000 MG IV SOLR
INTRAVENOUS | Status: DC | PRN
  Administered 2020-07-02: 1000 mg

## 2020-07-02 MED ORDER — MEPERIDINE HCL 25 MG/ML IJ SOLN: 6.2500 mg | INTRAMUSCULAR | Status: DC | PRN

## 2020-07-02 MED ORDER — BACITRACIN ZINC 500 UNIT/GM EX OINT
TOPICAL_OINTMENT | Freq: Two times a day (BID) | CUTANEOUS | Status: DC
  Administered 2020-07-02 – 2020-07-04 (×3): 1 via TOPICAL
  Filled 2020-07-02 (×2): qty 28.35
  Filled 2020-07-02: qty 0.9

## 2020-07-02 MED ORDER — DEXAMETHASONE SODIUM PHOSPHATE 10 MG/ML IJ SOLN
INTRAMUSCULAR | Status: AC
  Filled 2020-07-02: qty 1

## 2020-07-02 MED ORDER — PHENYLEPHRINE 40 MCG/ML (10ML) SYRINGE FOR IV PUSH (FOR BLOOD PRESSURE SUPPORT)
PREFILLED_SYRINGE | INTRAVENOUS | Status: DC | PRN
  Administered 2020-07-02 (×3): 200 ug via INTRAVENOUS

## 2020-07-02 MED ORDER — ONDANSETRON HCL 4 MG/2ML IJ SOLN
INTRAMUSCULAR | Status: AC
  Filled 2020-07-02: qty 2

## 2020-07-02 MED ORDER — OXYCODONE HCL 5 MG PO TABS
5.0000 mg | ORAL_TABLET | ORAL | Status: DC | PRN
  Administered 2020-07-02 – 2020-07-04 (×6): 10 mg via ORAL
  Filled 2020-07-02 (×6): qty 2

## 2020-07-02 MED ORDER — CEFAZOLIN SODIUM-DEXTROSE 2-4 GM/100ML-% IV SOLN
INTRAVENOUS | Status: AC
  Administered 2020-07-02: 17:00:00 2 g via INTRAVENOUS
  Filled 2020-07-02: qty 100

## 2020-07-02 MED ORDER — 0.9 % SODIUM CHLORIDE (POUR BTL) OPTIME
TOPICAL | Status: DC | PRN
  Administered 2020-07-02: 11:00:00 1000 mL

## 2020-07-02 MED ORDER — SUCCINYLCHOLINE CHLORIDE 200 MG/10ML IV SOSY
PREFILLED_SYRINGE | INTRAVENOUS | Status: DC | PRN
  Administered 2020-07-02: 160 mg via INTRAVENOUS

## 2020-07-02 MED ORDER — PROPOFOL 10 MG/ML IV BOLUS
INTRAVENOUS | Status: DC | PRN
  Administered 2020-07-02: 200 mg via INTRAVENOUS

## 2020-07-02 MED ORDER — OXYCODONE HCL 5 MG/5ML PO SOLN
5.0000 mg | Freq: Once | ORAL | Status: DC | PRN
Start: 2020-07-02 — End: 2020-07-02

## 2020-07-02 MED ORDER — ONDANSETRON HCL 4 MG/2ML IJ SOLN
INTRAMUSCULAR | Status: DC | PRN
  Administered 2020-07-02: 4 mg via INTRAVENOUS

## 2020-07-02 SURGICAL SUPPLY — 48 items
BIT DRILL SHORT 4.2 (BIT) ×2 IMPLANT
BRUSH SCRUB EZ PLAIN DRY (MISCELLANEOUS) ×6 IMPLANT
CHLORAPREP W/TINT 26 (MISCELLANEOUS) ×3 IMPLANT
COVER PERINEAL POST (MISCELLANEOUS) IMPLANT
COVER SURGICAL LIGHT HANDLE (MISCELLANEOUS) ×3 IMPLANT
COVER WAND RF STERILE (DRAPES) IMPLANT
DERMABOND ADVANCED (GAUZE/BANDAGES/DRESSINGS)
DERMABOND ADVANCED .7 DNX12 (GAUZE/BANDAGES/DRESSINGS) IMPLANT
DRAPE C-ARM 35X43 STRL (DRAPES) ×3 IMPLANT
DRAPE IMP U-DRAPE 54X76 (DRAPES) ×6 IMPLANT
DRAPE INCISE IOBAN 66X45 STRL (DRAPES) ×3 IMPLANT
DRAPE STERI IOBAN 125X83 (DRAPES) IMPLANT
DRAPE SURG 17X23 STRL (DRAPES) ×6 IMPLANT
DRAPE U-SHAPE 47X51 STRL (DRAPES) ×3 IMPLANT
DRILL BIT SHORT 4.2 (BIT) ×4
DRSG MEPILEX BORDER 4X4 (GAUZE/BANDAGES/DRESSINGS) ×9 IMPLANT
DRSG MEPILEX BORDER 4X8 (GAUZE/BANDAGES/DRESSINGS) IMPLANT
ELECT REM PT RETURN 9FT ADLT (ELECTROSURGICAL) ×3
ELECTRODE REM PT RTRN 9FT ADLT (ELECTROSURGICAL) ×1 IMPLANT
GLOVE BIO SURGEON STRL SZ 6.5 (GLOVE) ×10 IMPLANT
GLOVE BIO SURGEON STRL SZ7.5 (GLOVE) ×12 IMPLANT
GLOVE BIO SURGEONS STRL SZ 6.5 (GLOVE) ×5
GLOVE BIOGEL PI IND STRL 6.5 (GLOVE) ×1 IMPLANT
GLOVE BIOGEL PI IND STRL 7.5 (GLOVE) ×1 IMPLANT
GLOVE BIOGEL PI INDICATOR 6.5 (GLOVE) ×2
GLOVE BIOGEL PI INDICATOR 7.5 (GLOVE) ×2
GOWN STRL REUS W/ TWL LRG LVL3 (GOWN DISPOSABLE) ×1 IMPLANT
GOWN STRL REUS W/TWL LRG LVL3 (GOWN DISPOSABLE) ×2
GUIDEWIRE 3.2X400 (WIRE) ×6 IMPLANT
KIT BASIN OR (CUSTOM PROCEDURE TRAY) ×3 IMPLANT
KIT TURNOVER KIT B (KITS) ×3 IMPLANT
MANIFOLD NEPTUNE II (INSTRUMENTS) ×3 IMPLANT
NAIL CANN FRN TI 10X400 RT (Nail) ×3 IMPLANT
NS IRRIG 1000ML POUR BTL (IV SOLUTION) ×3 IMPLANT
PACK GENERAL/GYN (CUSTOM PROCEDURE TRAY) ×3 IMPLANT
PAD ARMBOARD 7.5X6 YLW CONV (MISCELLANEOUS) ×6 IMPLANT
REAMER ROD DEEP FLUTE 2.5X950 (INSTRUMENTS) ×3 IMPLANT
SCREW LOCK STAR 5X48 (Screw) ×3 IMPLANT
SCREW LOCK STAR 5X54 (Screw) ×3 IMPLANT
SCREW RECON 6.5X100MM (Screw) ×3 IMPLANT
SCREW RECON 6.5X110 (Screw) ×3 IMPLANT
SUT MNCRL AB 3-0 PS2 18 (SUTURE) ×3 IMPLANT
SUT VIC AB 0 CT1 27 (SUTURE)
SUT VIC AB 0 CT1 27XBRD ANBCTR (SUTURE) IMPLANT
SUT VIC AB 2-0 CT1 27 (SUTURE) ×2
SUT VIC AB 2-0 CT1 TAPERPNT 27 (SUTURE) ×1 IMPLANT
TOWEL GREEN STERILE (TOWEL DISPOSABLE) ×6 IMPLANT
WATER STERILE IRR 1000ML POUR (IV SOLUTION) ×3 IMPLANT

## 2020-07-02 NOTE — ED Notes (Signed)
Report called to pre-op 

## 2020-07-02 NOTE — ED Notes (Signed)
Pt seen with wrist splint off. Pt states "I feel constricted". Educated on importance of having splint in place.

## 2020-07-02 NOTE — ED Notes (Addendum)
Patient refused bladder scan. Pt states "ya'll want to scan me like ET, leave me alone until surgery. I'm tired of ya'll messing with me.

## 2020-07-02 NOTE — ED Notes (Signed)
Pt requests to turn off monitors at this time, pt complains because he cant sleep with all the beeping (from the monitors). Pt allows RN to take vitals as needed.

## 2020-07-02 NOTE — Consult Note (Signed)
Reason for Consult:Polytrauma Referring Physician: Fredonia Highland Time called: 0730 Time at bedside: 0820   Cory Griffin. is an 34 y.o. male.  HPI: Marques was on a roof helping a friend clean some gutters when he fell off, landing on his right side. He had immediate pain on the right side of his body and could not get up. He was brought to the ED where x-rays showed right hip, wrist, and elbow fxs in addition to other injuries and orthopedic surgery was consulted. He c/o localized pain to those areas. 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  History reviewed. No pertinent past medical history.  History reviewed. No pertinent surgical history.  No family history on file.  Social History:  reports that he has been smoking. He has never used smokeless tobacco. He reports current alcohol use. He reports that he does not use drugs.  Allergies: No Known Allergies  Medications: I have reviewed the patient's current medications.  Results for orders placed or performed during the hospital encounter of 07/01/20 (from the past 48 hour(s))  Comprehensive metabolic panel     Status: Abnormal   Collection Time: 07/01/20  4:24 PM  Result Value Ref Range   Sodium 133 (L) 135 - 145 mmol/L   Potassium 3.8 3.5 - 5.1 mmol/L   Chloride 98 98 - 111 mmol/L   CO2 23 22 - 32 mmol/L   Glucose, Bld 119 (H) 70 - 99 mg/dL    Comment: Glucose reference range applies only to samples taken after fasting for at least 8 hours.   BUN 7 6 - 20 mg/dL   Creatinine, Ser 6.29 0.61 - 1.24 mg/dL   Calcium 8.8 (L) 8.9 - 10.3 mg/dL   Total Protein 6.2 (L) 6.5 - 8.1 g/dL   Albumin 3.9 3.5 - 5.0 g/dL   AST 61 (H) 15 - 41 U/L   ALT 59 (H) 0 - 44 U/L   Alkaline Phosphatase 52 38 - 126 U/L   Total Bilirubin 1.3 (H) 0.3 - 1.2 mg/dL   GFR, Estimated >52 >84 mL/min    Comment: (NOTE) Calculated using the CKD-EPI Creatinine Equation (2021)    Anion gap 12 5 - 15    Comment: Performed at [facility]  Lab, [address], [place], [place] [address]  Ethanol     Status: None   Collection Time: 07/01/20  4:24 PM  Result Value Ref Range   Alcohol, Ethyl (B) <10 <10 mg/dL    Comment: (NOTE) Lowest detectable limit for serum alcohol is 10 mg/dL.  For medical purposes only. Performed at [facility], [address] [place], [place] [address]   Lactic acid, plasma     Status: None   Collection Time: 07/01/20  4:24 PM  Result Value Ref Range   Lactic Acid, Venous 1.2 0.5 - 1.9 mmol/L    Comment: Performed at [facility] Lab, [address], [place], [place] [address]  Protime-INR     Status: None   Collection Time: 07/01/20  4:24 PM  Result Value Ref Range   Prothrombin Time 13.0 11.4 - 15.2 seconds   INR 1.0 0.8 - 1.2    Comment: (NOTE) INR goal varies based on device and disease states. Performed at [facility] Lab, [address] [place], [place] [address]   Sample to Blood Bank     Status: None   Collection Time: 07/01/20  4:24 PM  Result Value Ref Range  Blood Bank Specimen SAMPLE AVAILABLE FOR TESTING    Sample Expiration      07/02/2020,2359 Performed at [facility] Lab, [address] [place], [place] [address]   Resp Panel by RT-PCR (Flu A&B, Covid) Nasopharyngeal Swab     Status: None   Collection Time: 07/01/20  4:24 PM   Specimen: Nasopharyngeal Swab; Nasopharyngeal(NP) swabs in vial transport medium  Result Value Ref Range   SARS Coronavirus 2 by RT PCR NEGATIVE NEGATIVE    Comment: (NOTE) SARS-CoV-2 target nucleic acids are NOT DETECTED.  The SARS-CoV-2 RNA is generally detectable in upper respiratory specimens during the acute phase of infection. The lowest concentration of SARS-CoV-2 viral copies this assay can detect is 138 copies/mL. A negative result does not preclude SARS-Cov-2 infection and should not be used as the sole basis for treatment or other patient management decisions. A negative result may occur with  improper specimen  collection/handling, submission of specimen other than nasopharyngeal swab, presence of viral mutation(s) within the areas targeted by this assay, and inadequate number of viral copies(<138 copies/mL). A negative result must be combined with clinical observations, patient history, and epidemiological information. The expected result is Negative.  Fact Sheet for Patients:  BloggerCourse.com  Fact Sheet for Healthcare Providers:  SeriousBroker.it  This test is no t yet approved or cleared by the [place] FDA and  has been authorized for detection and/or diagnosis of SARS-CoV-2 by FDA under an Emergency Use Authorization (EUA). This EUA will remain  in effect (meaning this test can be used) for the duration of the COVID-19 declaration under Section 564(b)(1) of the Act, 21 U.S.C.section 360bbb-3(b)(1), unless the authorization is terminated  or revoked sooner.       Influenza A by PCR NEGATIVE NEGATIVE   Influenza B by PCR NEGATIVE NEGATIVE    Comment: (NOTE) The Xpert Xpress SARS-CoV-2/FLU/RSV plus assay is intended as an aid in the diagnosis of influenza from Nasopharyngeal swab specimens and should not be used as a sole basis for treatment. Nasal washings and aspirates are unacceptable for Xpert Xpress SARS-CoV-2/FLU/RSV testing.  Fact Sheet for Patients: BloggerCourse.com  Fact Sheet for Healthcare Providers: SeriousBroker.it  This test is not yet approved or cleared by the [place] FDA and has been authorized for detection and/or diagnosis of SARS-CoV-2 by FDA under an Emergency Use Authorization (EUA). This EUA will remain in effect (meaning this test can be used) for the duration of the COVID-19 declaration under Section 564(b)(1) of the Act, 21 U.S.C. section 360bbb-3(b)(1), unless the authorization is terminated or revoked.  Performed at [facility]  Lab, [address] [place], [place] [address]   CBC with Differential     Status: Abnormal   Collection Time: 07/01/20  4:24 PM  Result Value Ref Range   WBC 14.8 (H) 4.0 - 10.5 K/uL   RBC 4.28 4.22 - 5.81 MIL/uL   Hemoglobin 14.5 13.0 - 17.0 g/dL   HCT 09.8 11.9 - 14.7 %   MCV 101.4 (H) 80.0 - 100.0 fL   MCH 33.9 26.0 - 34.0 pg   MCHC 33.4 30.0 - 36.0 g/dL   RDW 82.9 56.2 - 13.0 %   Platelets 177 150 - 400 K/uL   nRBC 0.0 0.0 - 0.2 %   Neutrophils Relative % 87 %   Neutro Abs 12.8 (H) 1.7 - 7.7 K/uL   Lymphocytes Relative 7 %   Lymphs Abs 1.0 0.7 - 4.0 K/uL   Monocytes Relative 6 %  Monocytes Absolute 0.8 0.1 - 1.0 K/uL   Eosinophils Relative 0 %   Eosinophils Absolute 0.0 0.0 - 0.5 K/uL   Basophils Relative 0 %   Basophils Absolute 0.0 0.0 - 0.1 K/uL   Immature Granulocytes 0 %   Abs Immature Granulocytes 0.06 0.00 - 0.07 K/uL    Comment: Performed at [facility] Lab, [address] [place], [place] [address]  I-Stat Chem 8, ED     Status: Abnormal   Collection Time: 07/01/20  4:41 PM  Result Value Ref Range   Sodium 135 135 - 145 mmol/L   Potassium 3.5 3.5 - 5.1 mmol/L   Chloride 102 98 - 111 mmol/L   BUN 7 6 - 20 mg/dL   Creatinine, Ser 5.28 (L) 0.61 - 1.24 mg/dL   Glucose, Bld 413 (H) 70 - 99 mg/dL    Comment: Glucose reference range applies only to samples taken after fasting for at least 8 hours.   Calcium, Ion 1.05 (L) 1.15 - 1.40 mmol/L   TCO2 23 22 - 32 mmol/L   Hemoglobin 15.3 13.0 - 17.0 g/dL   HCT 24.4 01.0 - 27.2 %  Urinalysis, Routine w reflex microscopic Urine, Clean Catch     Status: Abnormal   Collection Time: 07/01/20  8:04 PM  Result Value Ref Range   Color, Urine YELLOW YELLOW   APPearance CLEAR CLEAR   Specific Gravity, Urine 1.041 (H) 1.005 - 1.030   pH 7.0 5.0 - 8.0   Glucose, UA NEGATIVE NEGATIVE mg/dL   Hgb urine dipstick NEGATIVE NEGATIVE   Bilirubin Urine NEGATIVE NEGATIVE   Ketones, ur NEGATIVE NEGATIVE mg/dL   Protein, ur NEGATIVE  NEGATIVE mg/dL   Nitrite NEGATIVE NEGATIVE   Leukocytes,Ua NEGATIVE NEGATIVE    Comment: Performed at [facility] Lab, [address], [place], [place] [address]  Comprehensive metabolic panel     Status: Abnormal   Collection Time: 07/02/20  3:08 AM  Result Value Ref Range   Sodium 134 (L) 135 - 145 mmol/L   Potassium 4.0 3.5 - 5.1 mmol/L   Chloride 102 98 - 111 mmol/L   CO2 22 22 - 32 mmol/L   Glucose, Bld 117 (H) 70 - 99 mg/dL    Comment: Glucose reference range applies only to samples taken after fasting for at least 8 hours.   BUN 5 (L) 6 - 20 mg/dL   Creatinine, Ser 4.03 0.61 - 1.24 mg/dL   Calcium 8.6 (L) 8.9 - 10.3 mg/dL   Total Protein 5.7 (L) 6.5 - 8.1 g/dL   Albumin 3.7 3.5 - 5.0 g/dL   AST 44 (H) 15 - 41 U/L   ALT 47 (H) 0 - 44 U/L   Alkaline Phosphatase 44 38 - 126 U/L   Total Bilirubin 1.6 (H) 0.3 - 1.2 mg/dL   GFR, Estimated >47 >42 mL/min    Comment: (NOTE) Calculated using the CKD-EPI Creatinine Equation (2021)    Anion gap 10 5 - 15    Comment: Performed at [facility] Lab, [address] [place], [place] [address]  CBC     Status: Abnormal   Collection Time: 07/02/20  3:08 AM  Result Value Ref Range   WBC 10.4 4.0 - 10.5 K/uL   RBC 3.81 (L) 4.22 - 5.81 MIL/uL   Hemoglobin 13.0 13.0 - 17.0 g/dL   HCT 87.5 (L) 64.3 - 32.9 %   MCV 102.1 (H) 80.0 - 100.0 fL   MCH 34.1 (H) 26.0 -  34.0 pg   MCHC 33.4 30.0 - 36.0 g/dL   RDW 16.1 (L) 09.6 - 04.5 %   Platelets 162 150 - 400 K/uL   nRBC 0.0 0.0 - 0.2 %    Comment: Performed at [facility] Lab, [address], [place], [place] [address]    DG Chest 1 View  Result Date: 07/01/2020 CLINICAL DATA:  Fall from roof EXAM: CHEST  1 VIEW COMPARISON:  CT 07/01/2020 FINDINGS: The heart size and mediastinal contours are within normal limits. Both lungs are clear. The visualized skeletal structures are unremarkable. Nondisplaced fractures identified on companion CT involving the posterior RIGHT lower ribs are not  evident by plain film. IMPRESSION: 1. Posterior RIGHT rib fracture identified on companion CT are not clearly evident. 2. No pneumothorax. Electronically Signed   By: Genevive Bi M.D.   On: 07/01/2020 16:08   DG Shoulder Right  Result Date: 07/01/2020 CLINICAL DATA:  34 year old male with fall and right shoulder pain. EXAM: RIGHT SHOULDER - 2+ VIEW COMPARISON:  None. FINDINGS: There is no acute fracture or dislocation of the right shoulder. There are fractures of the posterior as well as anterior right seventh rib. Dedicated rib series may provide better evaluation. Probable mild subcutaneous contusion lateral to the shoulder. No radiopaque foreign object or soft tissue gas. IMPRESSION: 1. No acute fracture or dislocation of the right shoulder. 2. Fractures of the posterior and anterior right seventh rib. Dedicated rib series may provide better evaluation. Electronically Signed   By: Elgie Collard M.D.   On: 07/01/2020 15:32   DG Elbow 2 Views Right  Result Date: 07/01/2020 CLINICAL DATA:  Fall from roof with elbow pain, initial encounter EXAM: RIGHT ELBOW - 1 VIEW COMPARISON:  None. FINDINGS: Transverse fracture is noted through the radial neck just below the radial head with elevation of the anterior and posterior fat pads consistent with joint effusion. No other fracture is noted. IMPRESSION: Proximal radial fracture involving the neck as described. Electronically Signed   By: Alcide Clever M.D.   On: 07/01/2020 17:26   DG Forearm Right  Result Date: 07/01/2020 CLINICAL DATA:  Fall from roof with forearm pain, initial encounter EXAM: RIGHT FOREARM - 2 VIEW COMPARISON:  None. FINDINGS: There is a fracture through the radial neck with only minimal displacement identified. No other focal abnormality is noted. IMPRESSION: Proximal radius fracture involving the neck incompletely evaluated on this exam. Electronically Signed   By: Alcide Clever M.D.   On: 07/01/2020 17:25   DG Wrist Complete  Right  Result Date: 07/01/2020 CLINICAL DATA:  Fall off roof with right wrist pain. EXAM: RIGHT WRIST - COMPLETE 3+ VIEW COMPARISON:  None. FINDINGS: There is no evidence of fracture or dislocation. There is no evidence of arthropathy or other focal bone abnormality. Soft tissues are unremarkable. IMPRESSION: Negative radiographs of the right wrist. Electronically Signed   By: Narda Rutherford M.D.   On: 07/01/2020 15:31   CT Head Wo Contrast  Result Date: 07/01/2020 CLINICAL DATA:  Head trauma, moderate/severe. Neck trauma, dangerous injury mechanism. Additional history provided: Fall off of roof. EXAM: CT HEAD WITHOUT CONTRAST CT CERVICAL SPINE WITHOUT CONTRAST TECHNIQUE: Multidetector CT imaging of the head and cervical spine was performed following the standard protocol without intravenous contrast. Multiplanar CT image reconstructions of the cervical spine were also generated. COMPARISON:  No pertinent prior exams available for comparison. FINDINGS: CT HEAD FINDINGS Brain: Cerebral volume is normal. There is no acute intracranial hemorrhage. No demarcated cortical  infarct. No extra-axial fluid collection. No evidence of intracranial mass. No midline shift. Vascular: No hyperdense vessel. Skull: Deformity of the right sphenoid bone along the lateral aspect of the right orbital apex, likely reflecting a mildly displaced acute fracture (for instance as seen on series 4, image 31) (series 5, image 25). Sinuses/Orbits: Subtle irregularity of the right orbital floor is questioned and a nondisplaced fracture is difficult to exclude. Right periorbital and maxillofacial soft tissue swelling. Acute, mildly displaced fractures of the posterolateral wall of the right maxillary sinus. Air-fluid level within the right maxillary sinus likely reflecting the presence of hemorrhage. Small volume frothy secretions, small air-fluid level and small mucous retention cyst within the right sphenoid sinus. Mild paranasal sinus  mucosal thickening elsewhere. Other: Nondisplaced acute fracture of the right maxillary hard palate (for instance as seen on series 4, image 12). CT CERVICAL SPINE FINDINGS Alignment: Mild nonspecific reversal of the expected cervical lordosis. No significant spondylolisthesis. Skull base and vertebrae: The basion-dental and atlanto-dental intervals are maintained.No evidence of acute fracture to the cervical spine. Soft tissues and spinal canal: No prevertebral fluid or swelling. No visible canal hematoma. Disc levels: No significant bony spinal canal or neural foraminal narrowing at any level. Upper chest: No visible airspace consolidation or pneumothorax. IMPRESSION: CT head: 1. No evidence of acute intracranial abnormality. 2. Mildly displaced fracture of the right sphenoid bone along the lateral aspect of the right orbital apex. 3. Acute, mildly displaced fractures of the posterolateral wall of the right maxillary sinus. Nondisplaced acute fracture through the right maxillary hard palate. Subtle irregularity of the right orbital floor is also questioned and a nondisplaced right orbital floor fracture is difficult to exclude. A dedicated maxillofacial CT is recommended for further evaluation of these maxillofacial and skull base fractures. 4. Large air-fluid level within the right maxillary sinus likely reflecting the presence of blood products. 5. A small air-fluid level is also present within the right sphenoid sinus, which may reflect secretions or blood products. 6. Right periorbital/maxillofacial hematoma. CT cervical spine: 1. No evidence of acute fracture to the cervical spine. 2. Mild nonspecific reversal of the expected cervical lordosis. Electronically Signed   By: Jackey Loge DO   On: 07/01/2020 16:15   CT CHEST W CONTRAST  Result Date: 07/01/2020 CLINICAL DATA:  Status post fall from roof approximately 9 feet high. Presenting with hip pain. EXAM: CT ABDOMEN AND PELVIS WITH CONTRAST TECHNIQUE:  Multidetector CT imaging of the abdomen and pelvis was performed using the standard protocol following bolus administration of intravenous contrast. CONTRAST:  OMNIPAQUE IOHEXOL 300 MG/ML  SOLN COMPARISON:  None. FINDINGS: CHEST: Ports and Devices: None. Lungs/airways: Bilateral lower lobe subsegmental atelectasis. Nonspecific thin walled cystic lesion within the right upper lobe (5:39). No focal consolidation. No pulmonary nodule. No pulmonary mass. No definite pulmonary contusion or laceration. No pneumatocele formation. The central airways are patent. Pleura: No pleural effusion. Trace posterior right pneumothorax (5:76). No left pneumothorax. No hemothorax. Lymph Nodes: No mediastinal, hilar, or axillary lymphadenopathy. Mediastinum: No pneumomediastinum. No aortic injury or mediastinal hematoma. The thoracic aorta is normal in caliber. The heart is normal in size. No significant pericardial effusion. The esophagus is unremarkable. The thyroid is unremarkable. Chest Wall / Breasts: No chest wall mass. Musculoskeletal: No sternal fracture. Visualized portions of the scapula demonstrate no acute displaced fracture. Nondisplaced acute fracture of the right posterior fourth through eighth ribs with the right posterior seventh rib minimally displaced (1 mm). Acute nondisplaced fracture of the anterior  right third and fourth ribs. No spinal fracture. Visualized portions of the right upper extremity suggestive of an avulsion fracture of the triquetrum. ABDOMEN / PELVIS: Liver: Not enlarged. No focal lesion. No laceration or subcapsular hematoma. Biliary System: The gallbladder is otherwise unremarkable with no radio-opaque gallstones. No biliary ductal dilatation. Pancreas: Normal pancreatic contour. No main pancreatic duct dilatation. Spleen: Not enlarged. No focal lesion. No laceration, subcapsular hematoma, or vascular injury. Adrenal Glands: No nodularity bilaterally. Kidneys: Bilateral kidneys enhance  symmetrically. No hydronephrosis. No contusion, laceration, or subcapsular hematoma. No injury to the vascular structures or collecting systems. No hydroureter. On delayed imaging, there is no urothelial wall thickening and there are no filling defects in the opacified portions of the bilateral collecting systems or ureters. The urinary bladder is unremarkable. Bowel: No small or large bowel wall thickening or dilatation. The appendix is not definitely identified. Mesentery, Omentum, and Peritoneum: No simple free fluid ascites. No pneumoperitoneum. No hemoperitoneum. No mesenteric hematoma identified. No organized fluid collection. Pelvic Organs: Normal. Lymph Nodes: No abdominal, pelvic, inguinal lymphadenopathy. Vasculature: No abdominal aorta or iliac aneurysm. No active contrast extravasation or pseudoaneurysm. Musculoskeletal: No spinal fracture. No acute pelvic fracture; however, partially visualized acute comminuted fracture right greater trochanter extending to the right femoral neck as well as the right proximal femoral shaft. Associated subcutaneus soft tissue edema of the right gluteus medius as well as the right vastus intermedius muscle. IMPRESSION: 1. Trace (sliver) posterior right pneumothorax with associated right anterior third and fourth rib fractures as well as right posterior fourth through eighth ribs fractures. All these fracture are nondisplaced other than the right posterior seventh rib that is minimally displaced (1mm). 2. Acute comminuted fracture of the right femoral greater trochanter that extends to involve the right femoral neck as well as the right proximal femoral shaft. Femoral shaft fracture only partially visualized. Associated subcutaneus soft tissue edema of the right gluteus medius as well as the right vastus intermedius muscle with intramuscular hematoma not excluded. Recommend dedicated right femur x-rays. 3. Suggestion of an avulsion fracture of the right triquetrum.  Recommend dedicated right hand x-rays. 4. No acute intra-abdominal or intrapelvic traumatic injury. 5. No acute fracture or traumatic malalignment of the thoracic or lumbar spine. These results were called by telephone at the time of interpretation on 07/01/2020 at 4:13 pm to provider Dr. Charm BargesButler, who verbally acknowledged these results. Electronically Signed   By: Tish FredericksonMorgane  Naveau M.D.   On: 07/01/2020 16:14   CT Cervical Spine Wo Contrast  Result Date: 07/01/2020 CLINICAL DATA:  Head trauma, moderate/severe. Neck trauma, dangerous injury mechanism. Additional history provided: Fall off of roof. EXAM: CT HEAD WITHOUT CONTRAST CT CERVICAL SPINE WITHOUT CONTRAST TECHNIQUE: Multidetector CT imaging of the head and cervical spine was performed following the standard protocol without intravenous contrast. Multiplanar CT image reconstructions of the cervical spine were also generated. COMPARISON:  No pertinent prior exams available for comparison. FINDINGS: CT HEAD FINDINGS Brain: Cerebral volume is normal. There is no acute intracranial hemorrhage. No demarcated cortical infarct. No extra-axial fluid collection. No evidence of intracranial mass. No midline shift. Vascular: No hyperdense vessel. Skull: Deformity of the right sphenoid bone along the lateral aspect of the right orbital apex, likely reflecting a mildly displaced acute fracture (for instance as seen on series 4, image 31) (series 5, image 25). Sinuses/Orbits: Subtle irregularity of the right orbital floor is questioned and a nondisplaced fracture is difficult to exclude. Right periorbital and maxillofacial soft tissue swelling. Acute, mildly displaced  fractures of the posterolateral wall of the right maxillary sinus. Air-fluid level within the right maxillary sinus likely reflecting the presence of hemorrhage. Small volume frothy secretions, small air-fluid level and small mucous retention cyst within the right sphenoid sinus. Mild paranasal sinus mucosal  thickening elsewhere. Other: Nondisplaced acute fracture of the right maxillary hard palate (for instance as seen on series 4, image 12). CT CERVICAL SPINE FINDINGS Alignment: Mild nonspecific reversal of the expected cervical lordosis. No significant spondylolisthesis. Skull base and vertebrae: The basion-dental and atlanto-dental intervals are maintained.No evidence of acute fracture to the cervical spine. Soft tissues and spinal canal: No prevertebral fluid or swelling. No visible canal hematoma. Disc levels: No significant bony spinal canal or neural foraminal narrowing at any level. Upper chest: No visible airspace consolidation or pneumothorax. IMPRESSION: CT head: 1. No evidence of acute intracranial abnormality. 2. Mildly displaced fracture of the right sphenoid bone along the lateral aspect of the right orbital apex. 3. Acute, mildly displaced fractures of the posterolateral wall of the right maxillary sinus. Nondisplaced acute fracture through the right maxillary hard palate. Subtle irregularity of the right orbital floor is also questioned and a nondisplaced right orbital floor fracture is difficult to exclude. A dedicated maxillofacial CT is recommended for further evaluation of these maxillofacial and skull base fractures. 4. Large air-fluid level within the right maxillary sinus likely reflecting the presence of blood products. 5. A small air-fluid level is also present within the right sphenoid sinus, which may reflect secretions or blood products. 6. Right periorbital/maxillofacial hematoma. CT cervical spine: 1. No evidence of acute fracture to the cervical spine. 2. Mild nonspecific reversal of the expected cervical lordosis. Electronically Signed   By: Jackey Loge DO   On: 07/01/2020 16:15   CT ABDOMEN PELVIS W CONTRAST  Result Date: 07/01/2020 CLINICAL DATA:  Status post fall from roof approximately 9 feet high. Presenting with hip pain. EXAM: CT ABDOMEN AND PELVIS WITH CONTRAST TECHNIQUE:  Multidetector CT imaging of the abdomen and pelvis was performed using the standard protocol following bolus administration of intravenous contrast. CONTRAST:  OMNIPAQUE IOHEXOL 300 MG/ML  SOLN COMPARISON:  None. FINDINGS: CHEST: Ports and Devices: None. Lungs/airways: Bilateral lower lobe subsegmental atelectasis. Nonspecific thin walled cystic lesion within the right upper lobe (5:39). No focal consolidation. No pulmonary nodule. No pulmonary mass. No definite pulmonary contusion or laceration. No pneumatocele formation. The central airways are patent. Pleura: No pleural effusion. Trace posterior right pneumothorax (5:76). No left pneumothorax. No hemothorax. Lymph Nodes: No mediastinal, hilar, or axillary lymphadenopathy. Mediastinum: No pneumomediastinum. No aortic injury or mediastinal hematoma. The thoracic aorta is normal in caliber. The heart is normal in size. No significant pericardial effusion. The esophagus is unremarkable. The thyroid is unremarkable. Chest Wall / Breasts: No chest wall mass. Musculoskeletal: No sternal fracture. Visualized portions of the scapula demonstrate no acute displaced fracture. Nondisplaced acute fracture of the right posterior fourth through eighth ribs with the right posterior seventh rib minimally displaced (1 mm). Acute nondisplaced fracture of the anterior right third and fourth ribs. No spinal fracture. Visualized portions of the right upper extremity suggestive of an avulsion fracture of the triquetrum. ABDOMEN / PELVIS: Liver: Not enlarged. No focal lesion. No laceration or subcapsular hematoma. Biliary System: The gallbladder is otherwise unremarkable with no radio-opaque gallstones. No biliary ductal dilatation. Pancreas: Normal pancreatic contour. No main pancreatic duct dilatation. Spleen: Not enlarged. No focal lesion. No laceration, subcapsular hematoma, or vascular injury. Adrenal Glands: No nodularity bilaterally. Kidneys:  Bilateral kidneys enhance  symmetrically. No hydronephrosis. No contusion, laceration, or subcapsular hematoma. No injury to the vascular structures or collecting systems. No hydroureter. On delayed imaging, there is no urothelial wall thickening and there are no filling defects in the opacified portions of the bilateral collecting systems or ureters. The urinary bladder is unremarkable. Bowel: No small or large bowel wall thickening or dilatation. The appendix is not definitely identified. Mesentery, Omentum, and Peritoneum: No simple free fluid ascites. No pneumoperitoneum. No hemoperitoneum. No mesenteric hematoma identified. No organized fluid collection. Pelvic Organs: Normal. Lymph Nodes: No abdominal, pelvic, inguinal lymphadenopathy. Vasculature: No abdominal aorta or iliac aneurysm. No active contrast extravasation or pseudoaneurysm. Musculoskeletal: No spinal fracture. No acute pelvic fracture; however, partially visualized acute comminuted fracture right greater trochanter extending to the right femoral neck as well as the right proximal femoral shaft. Associated subcutaneus soft tissue edema of the right gluteus medius as well as the right vastus intermedius muscle. IMPRESSION: 1. Trace (sliver) posterior right pneumothorax with associated right anterior third and fourth rib fractures as well as right posterior fourth through eighth ribs fractures. All these fracture are nondisplaced other than the right posterior seventh rib that is minimally displaced (21mm). 2. Acute comminuted fracture of the right femoral greater trochanter that extends to involve the right femoral neck as well as the right proximal femoral shaft. Femoral shaft fracture only partially visualized. Associated subcutaneus soft tissue edema of the right gluteus medius as well as the right vastus intermedius muscle with intramuscular hematoma not excluded. Recommend dedicated right femur x-rays. 3. Suggestion of an avulsion fracture of the right triquetrum.  Recommend dedicated right hand x-rays. 4. No acute intra-abdominal or intrapelvic traumatic injury. 5. No acute fracture or traumatic malalignment of the thoracic or lumbar spine. These results were called by telephone at the time of interpretation on 07/01/2020 at 4:13 pm to provider Dr. Charm Barges, who verbally acknowledged these results. Electronically Signed   By: Tish Frederickson M.D.   On: 07/01/2020 16:14   DG Hand Complete Right  Result Date: 07/01/2020 CLINICAL DATA:  Recent fall from roof with hand pain, initial encounter EXAM: RIGHT HAND - COMPLETE 3+ VIEW COMPARISON:  None. FINDINGS: Along the posterior aspect of the carpal bones, there are several bony fragments identified likely related to mildly displaced triquetral fractures. No other focal abnormality noted. IMPRESSION: Fragments along the posterior aspect of the carpal bones consistent with triquetral fractures. Electronically Signed   By: Alcide Clever M.D.   On: 07/01/2020 17:28   DG Hip Unilat W or Wo Pelvis 2-3 Views Right  Result Date: 07/01/2020 CLINICAL DATA:  Fall off a roof. Right hip pain. EXAM: DG HIP (WITH OR WITHOUT PELVIS) 2-3V RIGHT COMPARISON:  None. FINDINGS: Mildly comminuted and displaced right proximal femur fracture involving the greater trochanter involving the lateral cortex and vertical extension into the subtrochanteric region. There is no involvement of the femoral neck. Femoral head remains seated. Pubic rami are intact. There is no additional fracture. IMPRESSION: Mildly comminuted and displaced right proximal femur fracture involving the greater trochanter and subtrochanteric region. Electronically Signed   By: Narda Rutherford M.D.   On: 07/01/2020 15:30   CT Maxillofacial Wo Contrast  Result Date: 07/01/2020 CLINICAL DATA:  Facial trauma. Fall with laceration to right cheek. EXAM: CT MAXILLOFACIAL WITHOUT CONTRAST TECHNIQUE: Multidetector CT imaging of the maxillofacial structures was performed. Multiplanar  CT image reconstructions were also generated. COMPARISON:  None. FINDINGS: Osseous: No acute fracture of the nasal bone, zygomatic  arches, or mandibles. Slight leftward nasal septal deviation. Temporomandibular joints are congruent. The right upper central incisor appears offset and broken. Orbits: Nondisplaced right inferior orbital wall fracture. No extraocular muscle entrapment or globe injury. Left orbit and globe are intact. Sinuses: Right maxillary sinus fracture with fracture lines involving the posterior and inferior wall as well as the posterolateral wall and roof. Adjacent soft tissue air. Moderate right hemosinus. Small fluid levels in the right side of sphenoid sinus without sphenoid sinus fracture. Mild mucosal thickening of left maxillary and right frontal sinus. Soft tissues: Soft tissue edema overlies the right cheek. No radiopaque foreign body. Limited intracranial: No significant or unexpected finding. IMPRESSION: 1. Nondisplaced right inferior orbital wall fracture. No extraocular muscle entrapment or globe injury. 2. Right maxillary sinus fracture with fracture lines involving the posterior and inferior wall as well as the posterolateral wall and roof. Moderate right hemosinus. 3. The right upper central incisor appears offset and broken. Electronically Signed   By: Narda Rutherford M.D.   On: 07/01/2020 17:05    Review of Systems  HENT: Negative for ear discharge, ear pain, hearing loss and tinnitus.   Eyes: Negative for photophobia and pain.  Respiratory: Negative for cough and shortness of breath.   Cardiovascular: Negative for chest pain.  Gastrointestinal: Negative for abdominal pain, nausea and vomiting.  Genitourinary: Negative for dysuria, flank pain, frequency and urgency.  Musculoskeletal: Positive for arthralgias (Right hip, elbow, wrist). Negative for back pain, myalgias and neck pain.  Neurological: Negative for dizziness and headaches.  Hematological: Does not  bruise/bleed easily.  Psychiatric/Behavioral: The patient is not nervous/anxious.    Blood pressure 137/84, pulse 93, temperature 98.5 F (36.9 C), temperature source Oral, resp. rate 15, height 6\' 1"  (1.854 m), weight 74.4 kg, SpO2 95 %. Physical Exam Constitutional:      General: He is not in acute distress.    Appearance: He is well-developed and well-nourished. He is not diaphoretic.  HENT:     Head: Normocephalic.  Eyes:     General: No scleral icterus.       Right eye: No discharge.        Left eye: No discharge.     Conjunctiva/sclera: Conjunctivae normal.  Cardiovascular:     Rate and Rhythm: Normal rate and regular rhythm.  Pulmonary:     Effort: Pulmonary effort is normal. No respiratory distress.  Musculoskeletal:     Cervical back: Normal range of motion.     Comments: Right shoulder, elbow, wrist, digits- no skin wounds, mod TTP wrist/elbow, no instability, no blocks to motion  Sens  Ax/R/M/U intact  Mot   Ax/ R/ PIN/ M/ AIN/ U grossly intact  Rad 2+  LLE No traumatic wounds, ecchymosis, or rash  Mod TTP hip  No knee or ankle effusion  Knee stable to varus/ valgus and anterior/posterior stress  Sens DPN, SPN, TN intact  Motor EHL, ext, flex, evers 5/5  DP 2+, PT 2+, No significant edema  Skin:    General: Skin is warm and dry.  Neurological:     Mental Status: He is alert.  Psychiatric:        Mood and Affect: Mood and affect normal.        Behavior: Behavior normal.     Assessment/Plan: Right hip fx -- Plan IMN this afternoon by Dr. Jena Gauss. Please keep NPO. Right radial head fx -- Sling Right triquetrum fx -- Wrist splint Other injuries including rib and facial fxs -- per trauma  service Tobacco use    Freeman Caldron, PA-C Orthopedic Surgery [phone] 07/02/2020, 8:39 AM

## 2020-07-02 NOTE — Anesthesia Procedure Notes (Signed)
Procedure Name: Intubation Date/Time: 07/02/2020 10:33 AM Performed by: Rosiland Oz, CRNA Pre-anesthesia Checklist: Patient identified, Emergency Drugs available, Suction available, Patient being monitored and Timeout performed Patient Re-evaluated:Patient Re-evaluated prior to induction Oxygen Delivery Method: Circle system utilized Preoxygenation: Pre-oxygenation with 100% oxygen Induction Type: IV induction Laryngoscope Size: Miller and 3 Grade View: Grade I Tube type: Oral Number of attempts: 1 Airway Equipment and Method: Stylet Placement Confirmation: positive ETCO2 and breath sounds checked- equal and bilateral Secured at: 23 cm Tube secured with: Tape Dental Injury: Teeth and Oropharynx as per pre-operative assessment

## 2020-07-02 NOTE — ED Notes (Signed)
Able to place right wrist splint back in place.

## 2020-07-02 NOTE — Progress Notes (Addendum)
Subjective: CC: Patient reports he was helping on a roof and feel yesterday ~78ft. He complains of right facial pain, right elbow pain, right hand pain, right rib pain and right hip pain. He denies HA, neck pain, changes in vision, back pain, sob, abdominal pain or LUE/LLE pain.  Patient is married and lives at home with his wife and father in [place]. He smokes 0.5PPD. Notes 2 alcoholic beverages every other day. 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 No illicit drug use. He denies prior medical hx or daily medication use.   ROS: See above, otherwise other systems negative    Objective: Vital signs in last 24 hours: Temp:  [97 F (36.1 C)-98.7 F (37.1 C)] 98.6 F (37 C) (12/17 0411) Pulse Rate:  [75-103] 86 (12/17 0411) Resp:  [13-22] 18 (12/17 0411) BP: (101-138)/(68-103) 132/88 (12/17 0411) SpO2:  [96 %-100 %] 99 % (12/17 0411) Weight:  [74.4 kg] 74.4 kg (12/16 1600)    Intake/Output from previous day: 12/16 0701 - 12/17 0700 In: 2000 [I.V.:2000] Out: 600 [Urine:600] Intake/Output this shift: No intake/output data recorded.  PE: General: pleasant, WD, male ho is laying in bed in NAD HEENT: Abrasions to right cheek that is dressed. Right periorbital edema.  Sclera are noninjected.  PERRL. EOMI without entrapment. Ears and nose without any masses or lesions. Patient with chronic appearing decay of right front tooth. He notes normal bite. No loose teeth with palpation. Does not note any new missing teeth. No bleeding.  Heart: regular, rate, and rhythm. Palpable radial and pedal pulses bilaterally Lungs: ? Decreased breath sounds of right apicies. Lungs otherwise cta b/l. Normal rate and effort. On RA.  Abd: Soft, NT, ND, +BS, no masses, hernias, or organomegaly MS: Right wrist in brace. Defer RUE and right hip exam to ortho given known injuries. Able rom of LUE without pain. No ttp over major joints of the LUE. Able active rom of the LLE at  hip, knee, ankle and digits. No ttp over major joints of the LLE. No tenderness over right knee, lower leg, ankle, or foot. DP 2+ b/l.  No LE edema Skin: warm and dry with no masses, lesions, or rashes Neuro: Cranial nerves 2-12 grossly intact, sensation is normal throughout Psych: A&Ox4 with an appropriate affect.  Lab Results:  Recent Labs    07/01/20 1624 07/01/20 1641 07/02/20 0308  WBC 14.8*  --  10.4  HGB 14.5 15.3 13.0  HCT 43.4 45.0 38.9*  PLT 177  --  162   BMET Recent Labs    07/01/20 1624 07/01/20 1641 07/02/20 0308  NA 133* 135 134*  K 3.8 3.5 4.0  CL 98 102 102  CO2 23  --  22  GLUCOSE 119* 120* 117*  BUN 7 7 5*  CREATININE 0.76 0.60* 0.81  CALCIUM 8.8*  --  8.6*   PT/INR Recent Labs    07/01/20 1624  LABPROT 13.0  INR 1.0   CMP     Component Value Date/Time   NA 134 (L) 07/02/2020 0308   K 4.0 07/02/2020 0308   CL 102 07/02/2020 0308   CO2 22 07/02/2020 0308   GLUCOSE 117 (H) 07/02/2020 0308   BUN 5 (L) 07/02/2020 0308   CREATININE 0.81 07/02/2020 0308   CALCIUM 8.6 (L) 07/02/2020 0308   PROT 5.7 (L) 07/02/2020 0308   ALBUMIN 3.7 07/02/2020 0308   AST 44 (H) 07/02/2020 0308  ALT 47 (H) 07/02/2020 0308   ALKPHOS 44 07/02/2020 0308   BILITOT 1.6 (H) 07/02/2020 0308   GFRNONAA >60 07/02/2020 0308   Lipase  No results found for: LIPASE     Studies/Results: DG Chest 1 View  Result Date: 07/01/2020 CLINICAL DATA:  Fall from roof EXAM: CHEST  1 VIEW COMPARISON:  CT 07/01/2020 FINDINGS: The heart size and mediastinal contours are within normal limits. Both lungs are clear. The visualized skeletal structures are unremarkable. Nondisplaced fractures identified on companion CT involving the posterior RIGHT lower ribs are not evident by plain film. IMPRESSION: 1. Posterior RIGHT rib fracture identified on companion CT are not clearly evident. 2. No pneumothorax. Electronically Signed   By: Genevive Bi M.D.   On: 07/01/2020 16:08   DG  Shoulder Right  Result Date: 07/01/2020 CLINICAL DATA:  34 year old male with fall and right shoulder pain. EXAM: RIGHT SHOULDER - 2+ VIEW COMPARISON:  None. FINDINGS: There is no acute fracture or dislocation of the right shoulder. There are fractures of the posterior as well as anterior right seventh rib. Dedicated rib series may provide better evaluation. Probable mild subcutaneous contusion lateral to the shoulder. No radiopaque foreign object or soft tissue gas. IMPRESSION: 1. No acute fracture or dislocation of the right shoulder. 2. Fractures of the posterior and anterior right seventh rib. Dedicated rib series may provide better evaluation. Electronically Signed   By: Elgie Collard M.D.   On: 07/01/2020 15:32   DG Elbow 2 Views Right  Result Date: 07/01/2020 CLINICAL DATA:  Fall from roof with elbow pain, initial encounter EXAM: RIGHT ELBOW - 1 VIEW COMPARISON:  None. FINDINGS: Transverse fracture is noted through the radial neck just below the radial head with elevation of the anterior and posterior fat pads consistent with joint effusion. No other fracture is noted. IMPRESSION: Proximal radial fracture involving the neck as described. Electronically Signed   By: Alcide Clever M.D.   On: 07/01/2020 17:26   DG Forearm Right  Result Date: 07/01/2020 CLINICAL DATA:  Fall from roof with forearm pain, initial encounter EXAM: RIGHT FOREARM - 2 VIEW COMPARISON:  None. FINDINGS: There is a fracture through the radial neck with only minimal displacement identified. No other focal abnormality is noted. IMPRESSION: Proximal radius fracture involving the neck incompletely evaluated on this exam. Electronically Signed   By: Alcide Clever M.D.   On: 07/01/2020 17:25   DG Wrist Complete Right  Result Date: 07/01/2020 CLINICAL DATA:  Fall off roof with right wrist pain. EXAM: RIGHT WRIST - COMPLETE 3+ VIEW COMPARISON:  None. FINDINGS: There is no evidence of fracture or dislocation. There is no  evidence of arthropathy or other focal bone abnormality. Soft tissues are unremarkable. IMPRESSION: Negative radiographs of the right wrist. Electronically Signed   By: Narda Rutherford M.D.   On: 07/01/2020 15:31   CT Head Wo Contrast  Result Date: 07/01/2020 CLINICAL DATA:  Head trauma, moderate/severe. Neck trauma, dangerous injury mechanism. Additional history provided: Fall off of roof. EXAM: CT HEAD WITHOUT CONTRAST CT CERVICAL SPINE WITHOUT CONTRAST TECHNIQUE: Multidetector CT imaging of the head and cervical spine was performed following the standard protocol without intravenous contrast. Multiplanar CT image reconstructions of the cervical spine were also generated. COMPARISON:  No pertinent prior exams available for comparison. FINDINGS: CT HEAD FINDINGS Brain: Cerebral volume is normal. There is no acute intracranial hemorrhage. No demarcated cortical infarct. No extra-axial fluid collection. No evidence of intracranial mass. No midline shift. Vascular: No hyperdense vessel.  Skull: Deformity of the right sphenoid bone along the lateral aspect of the right orbital apex, likely reflecting a mildly displaced acute fracture (for instance as seen on series 4, image 31) (series 5, image 25). Sinuses/Orbits: Subtle irregularity of the right orbital floor is questioned and a nondisplaced fracture is difficult to exclude. Right periorbital and maxillofacial soft tissue swelling. Acute, mildly displaced fractures of the posterolateral wall of the right maxillary sinus. Air-fluid level within the right maxillary sinus likely reflecting the presence of hemorrhage. Small volume frothy secretions, small air-fluid level and small mucous retention cyst within the right sphenoid sinus. Mild paranasal sinus mucosal thickening elsewhere. Other: Nondisplaced acute fracture of the right maxillary hard palate (for instance as seen on series 4, image 12). CT CERVICAL SPINE FINDINGS Alignment: Mild nonspecific reversal of  the expected cervical lordosis. No significant spondylolisthesis. Skull base and vertebrae: The basion-dental and atlanto-dental intervals are maintained.No evidence of acute fracture to the cervical spine. Soft tissues and spinal canal: No prevertebral fluid or swelling. No visible canal hematoma. Disc levels: No significant bony spinal canal or neural foraminal narrowing at any level. Upper chest: No visible airspace consolidation or pneumothorax. IMPRESSION: CT head: 1. No evidence of acute intracranial abnormality. 2. Mildly displaced fracture of the right sphenoid bone along the lateral aspect of the right orbital apex. 3. Acute, mildly displaced fractures of the posterolateral wall of the right maxillary sinus. Nondisplaced acute fracture through the right maxillary hard palate. Subtle irregularity of the right orbital floor is also questioned and a nondisplaced right orbital floor fracture is difficult to exclude. A dedicated maxillofacial CT is recommended for further evaluation of these maxillofacial and skull base fractures. 4. Large air-fluid level within the right maxillary sinus likely reflecting the presence of blood products. 5. A small air-fluid level is also present within the right sphenoid sinus, which may reflect secretions or blood products. 6. Right periorbital/maxillofacial hematoma. CT cervical spine: 1. No evidence of acute fracture to the cervical spine. 2. Mild nonspecific reversal of the expected cervical lordosis. Electronically Signed   By: Jackey Loge DO   On: 07/01/2020 16:15   CT CHEST W CONTRAST  Result Date: 07/01/2020 CLINICAL DATA:  Status post fall from roof approximately 9 feet high. Presenting with hip pain. EXAM: CT ABDOMEN AND PELVIS WITH CONTRAST TECHNIQUE: Multidetector CT imaging of the abdomen and pelvis was performed using the standard protocol following bolus administration of intravenous contrast. CONTRAST:  OMNIPAQUE IOHEXOL 300 MG/ML  SOLN COMPARISON:   None. FINDINGS: CHEST: Ports and Devices: None. Lungs/airways: Bilateral lower lobe subsegmental atelectasis. Nonspecific thin walled cystic lesion within the right upper lobe (5:39). No focal consolidation. No pulmonary nodule. No pulmonary mass. No definite pulmonary contusion or laceration. No pneumatocele formation. The central airways are patent. Pleura: No pleural effusion. Trace posterior right pneumothorax (5:76). No left pneumothorax. No hemothorax. Lymph Nodes: No mediastinal, hilar, or axillary lymphadenopathy. Mediastinum: No pneumomediastinum. No aortic injury or mediastinal hematoma. The thoracic aorta is normal in caliber. The heart is normal in size. No significant pericardial effusion. The esophagus is unremarkable. The thyroid is unremarkable. Chest Wall / Breasts: No chest wall mass. Musculoskeletal: No sternal fracture. Visualized portions of the scapula demonstrate no acute displaced fracture. Nondisplaced acute fracture of the right posterior fourth through eighth ribs with the right posterior seventh rib minimally displaced (1 mm). Acute nondisplaced fracture of the anterior right third and fourth ribs. No spinal fracture. Visualized portions of the right upper extremity suggestive of  an avulsion fracture of the triquetrum. ABDOMEN / PELVIS: Liver: Not enlarged. No focal lesion. No laceration or subcapsular hematoma. Biliary System: The gallbladder is otherwise unremarkable with no radio-opaque gallstones. No biliary ductal dilatation. Pancreas: Normal pancreatic contour. No main pancreatic duct dilatation. Spleen: Not enlarged. No focal lesion. No laceration, subcapsular hematoma, or vascular injury. Adrenal Glands: No nodularity bilaterally. Kidneys: Bilateral kidneys enhance symmetrically. No hydronephrosis. No contusion, laceration, or subcapsular hematoma. No injury to the vascular structures or collecting systems. No hydroureter. On delayed imaging, there is no urothelial wall thickening  and there are no filling defects in the opacified portions of the bilateral collecting systems or ureters. The urinary bladder is unremarkable. Bowel: No small or large bowel wall thickening or dilatation. The appendix is not definitely identified. Mesentery, Omentum, and Peritoneum: No simple free fluid ascites. No pneumoperitoneum. No hemoperitoneum. No mesenteric hematoma identified. No organized fluid collection. Pelvic Organs: Normal. Lymph Nodes: No abdominal, pelvic, inguinal lymphadenopathy. Vasculature: No abdominal aorta or iliac aneurysm. No active contrast extravasation or pseudoaneurysm. Musculoskeletal: No spinal fracture. No acute pelvic fracture; however, partially visualized acute comminuted fracture right greater trochanter extending to the right femoral neck as well as the right proximal femoral shaft. Associated subcutaneus soft tissue edema of the right gluteus medius as well as the right vastus intermedius muscle. IMPRESSION: 1. Trace (sliver) posterior right pneumothorax with associated right anterior third and fourth rib fractures as well as right posterior fourth through eighth ribs fractures. All these fracture are nondisplaced other than the right posterior seventh rib that is minimally displaced (66mm). 2. Acute comminuted fracture of the right femoral greater trochanter that extends to involve the right femoral neck as well as the right proximal femoral shaft. Femoral shaft fracture only partially visualized. Associated subcutaneus soft tissue edema of the right gluteus medius as well as the right vastus intermedius muscle with intramuscular hematoma not excluded. Recommend dedicated right femur x-rays. 3. Suggestion of an avulsion fracture of the right triquetrum. Recommend dedicated right hand x-rays. 4. No acute intra-abdominal or intrapelvic traumatic injury. 5. No acute fracture or traumatic malalignment of the thoracic or lumbar spine. These results were called by telephone at the  time of interpretation on 07/01/2020 at 4:13 pm to provider Dr. Charm Barges, who verbally acknowledged these results. Electronically Signed   By: Tish Frederickson M.D.   On: 07/01/2020 16:14   CT Cervical Spine Wo Contrast  Result Date: 07/01/2020 CLINICAL DATA:  Head trauma, moderate/severe. Neck trauma, dangerous injury mechanism. Additional history provided: Fall off of roof. EXAM: CT HEAD WITHOUT CONTRAST CT CERVICAL SPINE WITHOUT CONTRAST TECHNIQUE: Multidetector CT imaging of the head and cervical spine was performed following the standard protocol without intravenous contrast. Multiplanar CT image reconstructions of the cervical spine were also generated. COMPARISON:  No pertinent prior exams available for comparison. FINDINGS: CT HEAD FINDINGS Brain: Cerebral volume is normal. There is no acute intracranial hemorrhage. No demarcated cortical infarct. No extra-axial fluid collection. No evidence of intracranial mass. No midline shift. Vascular: No hyperdense vessel. Skull: Deformity of the right sphenoid bone along the lateral aspect of the right orbital apex, likely reflecting a mildly displaced acute fracture (for instance as seen on series 4, image 31) (series 5, image 25). Sinuses/Orbits: Subtle irregularity of the right orbital floor is questioned and a nondisplaced fracture is difficult to exclude. Right periorbital and maxillofacial soft tissue swelling. Acute, mildly displaced fractures of the posterolateral wall of the right maxillary sinus. Air-fluid level within the right maxillary sinus  likely reflecting the presence of hemorrhage. Small volume frothy secretions, small air-fluid level and small mucous retention cyst within the right sphenoid sinus. Mild paranasal sinus mucosal thickening elsewhere. Other: Nondisplaced acute fracture of the right maxillary hard palate (for instance as seen on series 4, image 12). CT CERVICAL SPINE FINDINGS Alignment: Mild nonspecific reversal of the expected  cervical lordosis. No significant spondylolisthesis. Skull base and vertebrae: The basion-dental and atlanto-dental intervals are maintained.No evidence of acute fracture to the cervical spine. Soft tissues and spinal canal: No prevertebral fluid or swelling. No visible canal hematoma. Disc levels: No significant bony spinal canal or neural foraminal narrowing at any level. Upper chest: No visible airspace consolidation or pneumothorax. IMPRESSION: CT head: 1. No evidence of acute intracranial abnormality. 2. Mildly displaced fracture of the right sphenoid bone along the lateral aspect of the right orbital apex. 3. Acute, mildly displaced fractures of the posterolateral wall of the right maxillary sinus. Nondisplaced acute fracture through the right maxillary hard palate. Subtle irregularity of the right orbital floor is also questioned and a nondisplaced right orbital floor fracture is difficult to exclude. A dedicated maxillofacial CT is recommended for further evaluation of these maxillofacial and skull base fractures. 4. Large air-fluid level within the right maxillary sinus likely reflecting the presence of blood products. 5. A small air-fluid level is also present within the right sphenoid sinus, which may reflect secretions or blood products. 6. Right periorbital/maxillofacial hematoma. CT cervical spine: 1. No evidence of acute fracture to the cervical spine. 2. Mild nonspecific reversal of the expected cervical lordosis. Electronically Signed   By: Jackey LogeKyle  Golden DO   On: 07/01/2020 16:15   CT ABDOMEN PELVIS W CONTRAST  Result Date: 07/01/2020 CLINICAL DATA:  Status post fall from roof approximately 9 feet high. Presenting with hip pain. EXAM: CT ABDOMEN AND PELVIS WITH CONTRAST TECHNIQUE: Multidetector CT imaging of the abdomen and pelvis was performed using the standard protocol following bolus administration of intravenous contrast. CONTRAST:  100mL OMNIPAQUE IOHEXOL 300 MG/ML  SOLN COMPARISON:  None.  FINDINGS: CHEST: Ports and Devices: None. Lungs/airways: Bilateral lower lobe subsegmental atelectasis. Nonspecific thin walled cystic lesion within the right upper lobe (5:39). No focal consolidation. No pulmonary nodule. No pulmonary mass. No definite pulmonary contusion or laceration. No pneumatocele formation. The central airways are patent. Pleura: No pleural effusion. Trace posterior right pneumothorax (5:76). No left pneumothorax. No hemothorax. Lymph Nodes: No mediastinal, hilar, or axillary lymphadenopathy. Mediastinum: No pneumomediastinum. No aortic injury or mediastinal hematoma. The thoracic aorta is normal in caliber. The heart is normal in size. No significant pericardial effusion. The esophagus is unremarkable. The thyroid is unremarkable. Chest Wall / Breasts: No chest wall mass. Musculoskeletal: No sternal fracture. Visualized portions of the scapula demonstrate no acute displaced fracture. Nondisplaced acute fracture of the right posterior fourth through eighth ribs with the right posterior seventh rib minimally displaced (1 mm). Acute nondisplaced fracture of the anterior right third and fourth ribs. No spinal fracture. Visualized portions of the right upper extremity suggestive of an avulsion fracture of the triquetrum. ABDOMEN / PELVIS: Liver: Not enlarged. No focal lesion. No laceration or subcapsular hematoma. Biliary System: The gallbladder is otherwise unremarkable with no radio-opaque gallstones. No biliary ductal dilatation. Pancreas: Normal pancreatic contour. No main pancreatic duct dilatation. Spleen: Not enlarged. No focal lesion. No laceration, subcapsular hematoma, or vascular injury. Adrenal Glands: No nodularity bilaterally. Kidneys: Bilateral kidneys enhance symmetrically. No hydronephrosis. No contusion, laceration, or subcapsular hematoma. No injury to the vascular  structures or collecting systems. No hydroureter. On delayed imaging, there is no urothelial wall thickening and  there are no filling defects in the opacified portions of the bilateral collecting systems or ureters. The urinary bladder is unremarkable. Bowel: No small or large bowel wall thickening or dilatation. The appendix is not definitely identified. Mesentery, Omentum, and Peritoneum: No simple free fluid ascites. No pneumoperitoneum. No hemoperitoneum. No mesenteric hematoma identified. No organized fluid collection. Pelvic Organs: Normal. Lymph Nodes: No abdominal, pelvic, inguinal lymphadenopathy. Vasculature: No abdominal aorta or iliac aneurysm. No active contrast extravasation or pseudoaneurysm. Musculoskeletal: No spinal fracture. No acute pelvic fracture; however, partially visualized acute comminuted fracture right greater trochanter extending to the right femoral neck as well as the right proximal femoral shaft. Associated subcutaneus soft tissue edema of the right gluteus medius as well as the right vastus intermedius muscle. IMPRESSION: 1. Trace (sliver) posterior right pneumothorax with associated right anterior third and fourth rib fractures as well as right posterior fourth through eighth ribs fractures. All these fracture are nondisplaced other than the right posterior seventh rib that is minimally displaced (1mm). 2. Acute comminuted fracture of the right femoral greater trochanter that extends to involve the right femoral neck as well as the right proximal femoral shaft. Femoral shaft fracture only partially visualized. Associated subcutaneus soft tissue edema of the right gluteus medius as well as the right vastus intermedius muscle with intramuscular hematoma not excluded. Recommend dedicated right femur x-rays. 3. Suggestion of an avulsion fracture of the right triquetrum. Recommend dedicated right hand x-rays. 4. No acute intra-abdominal or intrapelvic traumatic injury. 5. No acute fracture or traumatic malalignment of the thoracic or lumbar spine. These results were called by telephone at the time  of interpretation on 07/01/2020 at 4:13 pm to provider Dr. Charm Barges, who verbally acknowledged these results. Electronically Signed   By: Tish Frederickson M.D.   On: 07/01/2020 16:14   DG Hand Complete Right  Result Date: 07/01/2020 CLINICAL DATA:  Recent fall from roof with hand pain, initial encounter EXAM: RIGHT HAND - COMPLETE 3+ VIEW COMPARISON:  None. FINDINGS: Along the posterior aspect of the carpal bones, there are several bony fragments identified likely related to mildly displaced triquetral fractures. No other focal abnormality noted. IMPRESSION: Fragments along the posterior aspect of the carpal bones consistent with triquetral fractures. Electronically Signed   By: Alcide Clever M.D.   On: 07/01/2020 17:28   DG Hip Unilat W or Wo Pelvis 2-3 Views Right  Result Date: 07/01/2020 CLINICAL DATA:  Fall off a roof. Right hip pain. EXAM: DG HIP (WITH OR WITHOUT PELVIS) 2-3V RIGHT COMPARISON:  None. FINDINGS: Mildly comminuted and displaced right proximal femur fracture involving the greater trochanter involving the lateral cortex and vertical extension into the subtrochanteric region. There is no involvement of the femoral neck. Femoral head remains seated. Pubic rami are intact. There is no additional fracture. IMPRESSION: Mildly comminuted and displaced right proximal femur fracture involving the greater trochanter and subtrochanteric region. Electronically Signed   By: Narda Rutherford M.D.   On: 07/01/2020 15:30   CT Maxillofacial Wo Contrast  Result Date: 07/01/2020 CLINICAL DATA:  Facial trauma. Fall with laceration to right cheek. EXAM: CT MAXILLOFACIAL WITHOUT CONTRAST TECHNIQUE: Multidetector CT imaging of the maxillofacial structures was performed. Multiplanar CT image reconstructions were also generated. COMPARISON:  None. FINDINGS: Osseous: No acute fracture of the nasal bone, zygomatic arches, or mandibles. Slight leftward nasal septal deviation. Temporomandibular joints are congruent.  The right upper central incisor  appears offset and broken. Orbits: Nondisplaced right inferior orbital wall fracture. No extraocular muscle entrapment or globe injury. Left orbit and globe are intact. Sinuses: Right maxillary sinus fracture with fracture lines involving the posterior and inferior wall as well as the posterolateral wall and roof. Adjacent soft tissue air. Moderate right hemosinus. Small fluid levels in the right side of sphenoid sinus without sphenoid sinus fracture. Mild mucosal thickening of left maxillary and right frontal sinus. Soft tissues: Soft tissue edema overlies the right cheek. No radiopaque foreign body. Limited intracranial: No significant or unexpected finding. IMPRESSION: 1. Nondisplaced right inferior orbital wall fracture. No extraocular muscle entrapment or globe injury. 2. Right maxillary sinus fracture with fracture lines involving the posterior and inferior wall as well as the posterolateral wall and roof. Moderate right hemosinus. 3. The right upper central incisor appears offset and broken. Electronically Signed   By: Narda Rutherford M.D.   On: 07/01/2020 17:05    Anti-infectives: Anti-infectives (From admission, onward)   None       Assessment/Plan Fall off of roof Right apical PTX - Repeat CXR this AM.  Right rib fx 3-8 - Multimodal pain control, pulm toilet  Right Comminuted femur/femoral neck fx - Per Ortho, Dr. Jena Gauss. NWB RLE. They plan for OR today. Await official consult recommendations  Right radial neck fx - Per Ortho, Dr. Jena Gauss. Per notes this is non-operative. Sling for comfort. Await official consult recommendations  Right  Triquetral fx -  Per Ortho, Dr. Jena Gauss. Per notes this is nonoperative. Removable wrist splint. Await official consult recommendations.  Right inferior orbit wall/max sinus fx w/ right upper central incisor broken - Await ENT consult pending, Dr. Pollyann Kennedy Elevated transaminases - continue to trend. No abdominal pain. No CT scan  findings to suggest hepatobiliary injury.  Abrasions - Local wound care with bacitracin Tobacco use - Avoid nicotine replacement in setting of ortho injuries.  FEN - NPO for OR, IVF VTE - SCDs, Loveonx ID - None Foley - Given comminuted femur/femoral neck fx, discussed with my attending about early foley placement and new protocol. Do not place foley at this time. Will plan for scheduled bladder scans to ensure patient does not go into retention.  Dispo - To OR with Ortho. ENT consult pending. PT/OT post op. Lives at home with his GF and father in [place].     LOS: 1 day    Jacinto Halim , [facility] 07/02/2020, 8:06 AM Please see Amion for pager number during day hours 7:00am-4:30pm

## 2020-07-02 NOTE — ED Notes (Signed)
Trauma MD at bedside.

## 2020-07-02 NOTE — Progress Notes (Signed)
Orthopedic Tech Progress Note Patient Details:  Cory Griffin 07/27/1985 [id number]  Ortho Devices Type of Ortho Device: Sling immobilizer Ortho Device/Splint Location: RUE Ortho Device/Splint Interventions: Ordered,Application,Adjustment   Post Interventions Patient Tolerated: Well Instructions Provided: Adjustment of device,Care of device   Cory Griffin 07/02/2020, 9:52 AM

## 2020-07-02 NOTE — ED Notes (Signed)
Ortho tech notified of need for arm sling.

## 2020-07-02 NOTE — Op Note (Signed)
Orthopaedic Surgery Operative Note (CSN: [id number] ) Date of Surgery: 07/02/2020  Admit Date: 07/01/2020   Diagnoses: Pre-Op Diagnoses: Right proximal femur fracture Right radial neck fracture Right triquetral avulsion fracture   Post-Op Diagnosis: Same  Procedures: 1. CPT 27506-Cephalomedullary nailing of right femur fracture 2. CPT 25630-Closed treatment of right triquetrum fracture 3. CPT 24650-Closed treatment of right proximal radius fracture  Surgeons : Primary: Monasia Lair, Gillie Manners, MD  Assistant: Ulyses Southward, PA-C  Location: OR 3   Anesthesia:General  Antibiotics: Ancef 2g preop with topically vancomycin powder   Tourniquet time:None  Estimated Blood Loss:50 mL  Complications:None   Specimens:None   Implants: Implant Name Type Inv. Item Serial No. Manufacturer Lot No. LRB No. Used Action  NAIL CANN FRN TI 10X400 RT - [id number] Nail NAIL CANN FRN TI 10X400 RT  [employer] [id number] Right 1 Implanted  SCREW RECON 6.5X110 - [id number] Screw SCREW RECON 6.5X110  [employer] [id number] Right 1 Implanted  SCREW RECON 6.5X100MM - [id number] Screw SCREW RECON 6.5X100MM  [employer] [id number] Right 1 Implanted  SCREW LOCK STAR 5X48 - [id number] Screw SCREW LOCK STAR 5X48  [employer]  Right 1 Implanted  SCREW LOCK STAR 5X54 - [id number] Screw SCREW LOCK STAR 5X54  [employer]  Right 1 Implanted     Indications for Surgery: 34 year old male who fell from a roof and sustained multiple injuries including a right minimally displaced proximal femur fracture, nondisplaced right radial neck fracture, right triquetral avulsion fracture, as well as multiple rib fractures and facial fractures.  Due to the potential displacement of his proximal femur fracture I recommended proceeding with cephalomedullary nailing of the right femur.  Risks and benefits were discussed with the patient.  Risks included but not limited to bleeding, infection, malunion, nonunion,  hardware failure, hardware irritation, nerve and blood vessel injury, DVT, even the possibility anesthetic complications.  The patient agreed to proceed with surgery and consent was obtained.  Operative Findings: 1.  Cephalomedullary nailing of right proximal femur fracture using [employer] FRN 10 x 400 mm nail with a 2 femoral recon screws. 2.  Nonoperative treatment of right proximal radius and right wrist avulsion fractures  Procedure: The patient was identified in the preoperative holding area. Consent was confirmed with the patient and their family and all questions were answered. The operative extremity was marked after confirmation with the patient. he was then brought back to the operating room by our anesthesia colleagues.  He was placed under general anesthetic and carefully transferred over to a radiolucent flat top table.  A bump was placed under his operative hip.  The right lower extremity was then prepped and draped in usual sterile fashion.  Timeout was performed to verify the patient, the procedure, and the extremity.  Preoperative antibiotics were dosed.  Fluoroscopic imaging showed the fracture.  A incision was made proximal to the greater trochanter.  The gluteal fascia was split in line with the incision.  I directed a threaded guidewire at the tip of the greater trochanter into the proximal metaphysis.  I confirmed positioning with AP and lateral fluoroscopic imaging.  I then used an entry reamer to enter the medullary canal.  I then passed a ball-tipped guidewire down the center of the canal.  I then seated it into the distal metaphysis.  I measured the length of the guidewire and chose to use a 400 mm nail.  I then sequentially reamed from 8.55mm to 11.5 mm and chose to place a 10 mm nail.  The nail was passed down the center of the canal and seated until it was flush and buried with the greater trochanter.  I then used the targeting arm to percutaneously make incisions in the resting  threaded guidewires up into the head neck segment for femoral recon screw placement.  I confirmed positioning of the K wires using AP and lateral fluoroscopic imaging.  I then measured the length of the screws took the K wires out drilled tapped and placed the screws both in the inferior and proximal segment of the neck.  Excellent fixation was obtained.  I then removed the targeting arm and used perfect circle technique to place lateral to medial distal interlocking screws.  Final fluoroscopic imaging was obtained.  The incision was copiously irrigated.  A gram of vancomycin powder was placed into the incisions.  Layer closure of 2-0 Vicryl and 3-0 Monocryl with Dermabond was used to close the skin.  The patient was then awoken from anesthesia and taken to the PACU in stable condition.  Post Op Plan/Instructions: Patient may be weightbearing as tolerated to the right lower and right upper extremity.  Range of motion as tolerated to the right upper extremity.  Removable wrist splint and sling for the right upper extremity as needed.  The patient may need to be weightbearing through a platform walker if he is unable to tolerate force through his forearm.  Patient will receive postoperative Ancef.  Placed on Lovenox for DVT prophylaxis.  We will have him mobilize with physical and Occupational Therapy.  I was present and performed the entire surgery.  Ulyses Southward, PA-C did assist me throughout the case. An assistant was necessary given the difficulty in approach, maintenance of reduction and ability to instrument the fracture.   Truitt Merle, MD [facility]

## 2020-07-02 NOTE — Progress Notes (Addendum)
Patient refused to put on telemetry and very irritable and preparing to eat dinner, PR at 117 causing Yellow MEWS but will recheck VS after his dinner.  On call Trauma MD, Dr. Cliffton Asters made aware, awaiting order/reply.

## 2020-07-02 NOTE — Transfer of Care (Signed)
Immediate Anesthesia Transfer of Care Note  Patient: Cory Griffin.  Procedure(s) Performed: INTRAMEDULLARY (IM) NAIL INTERTROCHANTRIC (Right Leg Upper)  Patient Location: PACU  Anesthesia Type:General  Level of Consciousness: drowsy and patient cooperative  Airway & Oxygen Therapy: Patient Spontanous Breathing  Post-op Assessment: Report given to RN and Post -op Vital signs reviewed and stable  Post vital signs: Reviewed and stable  Last Vitals:  Vitals Value Taken Time  BP    Temp    Pulse 122 07/02/20 1211  Resp 26 07/02/20 1211  SpO2 89 % 07/02/20 1211  Vitals shown include unvalidated device data.  Last Pain:  Vitals:   07/02/20 1003  TempSrc:   PainSc: 10-Worst pain ever      Patients Stated Pain Goal: 2 (07/02/20 1003)  Complications: No complications documented.

## 2020-07-02 NOTE — Anesthesia Preprocedure Evaluation (Addendum)
Anesthesia Evaluation  Patient identified by MRN, date of birth, ID band Patient awake    Reviewed: Allergy & Precautions, NPO status , Patient's Chart, lab work & pertinent test results  Airway Mallampati: II  TM Distance: >3 FB Neck ROM: Full    Dental  (+) Missing, Chipped, Dental Advisory Given,    Pulmonary Current Smoker and Patient abstained from smoking.,    Pulmonary exam normal breath sounds clear to auscultation       Cardiovascular negative cardio ROS Normal cardiovascular exam Rhythm:Regular Rate:Normal     Neuro/Psych negative neurological ROS     GI/Hepatic negative GI ROS, Neg liver ROS,   Endo/Other  negative endocrine ROS  Renal/GU negative Renal ROS     Musculoskeletal negative musculoskeletal ROS (+)   Abdominal   Peds  Hematology negative hematology ROS (+)   Anesthesia Other Findings   Reproductive/Obstetrics                            Anesthesia Physical Anesthesia Plan  ASA: II  Anesthesia Plan: General   Post-op Pain Management:    Induction: Intravenous  PONV Risk Score and Plan: 2 and Ondansetron, Dexamethasone, Treatment may vary due to age or medical condition and Midazolam  Airway Management Planned: Oral ETT  Additional Equipment:   Intra-op Plan:   Post-operative Plan: Extubation in OR  Informed Consent: I have reviewed the patients History and Physical, chart, labs and discussed the procedure including the risks, benefits and alternatives for the proposed anesthesia with the patient or authorized representative who has indicated his/her understanding and acceptance.     Dental advisory given  Plan Discussed with: CRNA  Anesthesia Plan Comments:        Anesthesia Quick Evaluation

## 2020-07-02 NOTE — Progress Notes (Signed)
Recheck VS and in Green MEWS now but slightly elevated PR at 105 since very irritable when I place the telemetry monitor.  Patient said that he will removed the leads later because they disturbed his slept.  Gave scheduled HS medication and PRN Oxy 10 mg due to pain at 8/10.  Patient does not want to be disturbed while asleep.  Will monitor.

## 2020-07-02 NOTE — Consult Note (Signed)
Reason for Consult: Facial injuries Referring Physician: Md, Trauma, MD  Cory Griffin. is an 34 y.o. male.  HPI: He was drinking and fell off a roof yesterday.  He has been evaluated in the emergency department and is going to have surgery for extremity injury by orthopedics.  He is somewhat belligerent and not overly cooperative.  History reviewed. No pertinent past medical history.  History reviewed. No pertinent surgical history.  No family history on file.  Social History:  reports that he has been smoking. He has never used smokeless tobacco. He reports current alcohol use. He reports that he does not use drugs.  Allergies: No Known Allergies  Medications: Reviewed  Results for orders placed or performed during the hospital encounter of 07/01/20 (from the past 48 hour(s))  Comprehensive metabolic panel     Status: Abnormal   Collection Time: 07/01/20  4:24 PM  Result Value Ref Range   Sodium 133 (L) 135 - 145 mmol/L   Potassium 3.8 3.5 - 5.1 mmol/L   Chloride 98 98 - 111 mmol/L   CO2 23 22 - 32 mmol/L   Glucose, Bld 119 (H) 70 - 99 mg/dL    Comment: Glucose reference range applies only to samples taken after fasting for at least 8 hours.   BUN 7 6 - 20 mg/dL   Creatinine, Ser 2.13 0.61 - 1.24 mg/dL   Calcium 8.8 (L) 8.9 - 10.3 mg/dL   Total Protein 6.2 (L) 6.5 - 8.1 g/dL   Albumin 3.9 3.5 - 5.0 g/dL   AST 61 (H) 15 - 41 U/L   ALT 59 (H) 0 - 44 U/L   Alkaline Phosphatase 52 38 - 126 U/L   Total Bilirubin 1.3 (H) 0.3 - 1.2 mg/dL   GFR, Estimated >08 >65 mL/min    Comment: (NOTE) Calculated using the CKD-EPI Creatinine Equation (2021)    Anion gap 12 5 - 15    Comment: Performed at [facility], [address], [place], [place] [address]  Ethanol     Status: None   Collection Time: 07/01/20  4:24 PM  Result Value Ref Range   Alcohol, Ethyl (B) <10 <10 mg/dL    Comment: (NOTE) Lowest detectable limit for serum alcohol is 10 mg/dL.  For medical purposes  only. Performed at [facility], [address], [place], [place] [address]   Lactic acid, plasma     Status: None   Collection Time: 07/01/20  4:24 PM  Result Value Ref Range   Lactic Acid, Venous 1.2 0.5 - 1.9 mmol/L    Comment: Performed at [facility], [address], [place], [place] [address]  Protime-INR     Status: None   Collection Time: 07/01/20  4:24 PM  Result Value Ref Range   Prothrombin Time 13.0 11.4 - 15.2 seconds   INR 1.0 0.8 - 1.2    Comment: (NOTE) INR goal varies based on device and disease states. Performed at [facility], [address] [place], [place] [address]   Sample to Blood Bank     Status: None   Collection Time: 07/01/20  4:24 PM  Result Value Ref Range   Blood Bank Specimen SAMPLE AVAILABLE FOR TESTING    Sample Expiration      07/02/2020,2359 Performed at [facility], [address], [place], [place] [address]   Resp Panel by RT-PCR (Flu A&B, Covid) Nasopharyngeal Swab     Status: None  Collection Time: 07/01/20  4:24 PM   Specimen: Nasopharyngeal Swab; Nasopharyngeal(NP) swabs in vial transport medium  Result Value Ref Range   SARS Coronavirus 2 by RT PCR NEGATIVE NEGATIVE    Comment: (NOTE) SARS-CoV-2 target nucleic acids are NOT DETECTED.  The SARS-CoV-2 RNA is generally detectable in upper respiratory specimens during the acute phase of infection. The lowest concentration of SARS-CoV-2 viral copies this assay can detect is 138 copies/mL. A negative result does not preclude SARS-Cov-2 infection and should not be used as the sole basis for treatment or other patient management decisions. A negative result may occur with  improper specimen collection/handling, submission of specimen other than nasopharyngeal swab, presence of viral mutation(s) within the areas targeted by this assay, and inadequate number of viral copies(<138 copies/mL). A negative result must be combined with clinical observations, patient  history, and epidemiological information. The expected result is Negative.  Fact Sheet for Patients:  BloggerCourse.com  Fact Sheet for Healthcare Providers:  SeriousBroker.it  This test is no t yet approved or cleared by the [place] FDA and  has been authorized for detection and/or diagnosis of SARS-CoV-2 by FDA under an Emergency Use Authorization (EUA). This EUA will remain  in effect (meaning this test can be used) for the duration of the COVID-19 declaration under Section 564(b)(1) of the Act, 21 U.S.C.section 360bbb-3(b)(1), unless the authorization is terminated  or revoked sooner.       Influenza A by PCR NEGATIVE NEGATIVE   Influenza B by PCR NEGATIVE NEGATIVE    Comment: (NOTE) The Xpert Xpress SARS-CoV-2/FLU/RSV plus assay is intended as an aid in the diagnosis of influenza from Nasopharyngeal swab specimens and should not be used as a sole basis for treatment. Nasal washings and aspirates are unacceptable for Xpert Xpress SARS-CoV-2/FLU/RSV testing.  Fact Sheet for Patients: BloggerCourse.com  Fact Sheet for Healthcare Providers: SeriousBroker.it  This test is not yet approved or cleared by the [place] FDA and has been authorized for detection and/or diagnosis of SARS-CoV-2 by FDA under an Emergency Use Authorization (EUA). This EUA will remain in effect (meaning this test can be used) for the duration of the COVID-19 declaration under Section 564(b)(1) of the Act, 21 U.S.C. section 360bbb-3(b)(1), unless the authorization is terminated or revoked.  Performed at [facility], [address] [place], [place] [address]   CBC with Differential     Status: Abnormal   Collection Time: 07/01/20  4:24 PM  Result Value Ref Range   WBC 14.8 (H) 4.0 - 10.5 K/uL   RBC 4.28 4.22 - 5.81 MIL/uL   Hemoglobin 14.5 13.0 - 17.0 g/dL   HCT 52.7 78.2 - 42.3 %    MCV 101.4 (H) 80.0 - 100.0 fL   MCH 33.9 26.0 - 34.0 pg   MCHC 33.4 30.0 - 36.0 g/dL   RDW 53.6 14.4 - 31.5 %   Platelets 177 150 - 400 K/uL   nRBC 0.0 0.0 - 0.2 %   Neutrophils Relative % 87 %   Neutro Abs 12.8 (H) 1.7 - 7.7 K/uL   Lymphocytes Relative 7 %   Lymphs Abs 1.0 0.7 - 4.0 K/uL   Monocytes Relative 6 %   Monocytes Absolute 0.8 0.1 - 1.0 K/uL   Eosinophils Relative 0 %   Eosinophils Absolute 0.0 0.0 - 0.5 K/uL   Basophils Relative 0 %   Basophils Absolute 0.0 0.0 - 0.1 K/uL   Immature Granulocytes 0 %   Abs Immature Granulocytes 0.06 0.00 -  0.07 K/uL    Comment: Performed at [facility], [address], [place], [place] [address]  I-Stat Chem 8, ED     Status: Abnormal   Collection Time: 07/01/20  4:41 PM  Result Value Ref Range   Sodium 135 135 - 145 mmol/L   Potassium 3.5 3.5 - 5.1 mmol/L   Chloride 102 98 - 111 mmol/L   BUN 7 6 - 20 mg/dL   Creatinine, Ser 6.21 (L) 0.61 - 1.24 mg/dL   Glucose, Bld 308 (H) 70 - 99 mg/dL    Comment: Glucose reference range applies only to samples taken after fasting for at least 8 hours.   Calcium, Ion 1.05 (L) 1.15 - 1.40 mmol/L   TCO2 23 22 - 32 mmol/L   Hemoglobin 15.3 13.0 - 17.0 g/dL   HCT 65.7 84.6 - 96.2 %  Urinalysis, Routine w reflex microscopic Urine, Clean Catch     Status: Abnormal   Collection Time: 07/01/20  8:04 PM  Result Value Ref Range   Color, Urine YELLOW YELLOW   APPearance CLEAR CLEAR   Specific Gravity, Urine 1.041 (H) 1.005 - 1.030   pH 7.0 5.0 - 8.0   Glucose, UA NEGATIVE NEGATIVE mg/dL   Hgb urine dipstick NEGATIVE NEGATIVE   Bilirubin Urine NEGATIVE NEGATIVE   Ketones, ur NEGATIVE NEGATIVE mg/dL   Protein, ur NEGATIVE NEGATIVE mg/dL   Nitrite NEGATIVE NEGATIVE   Leukocytes,Ua NEGATIVE NEGATIVE    Comment: Performed at [facility], [address] [place], [place] [address]  HIV Antibody (routine testing w rflx)     Status: None   Collection Time: 07/02/20  3:08 AM  Result Value  Ref Range   HIV Screen 4th Generation wRfx Non Reactive Non Reactive    Comment: Performed at [facility], [address] [place], [place] [address]  Comprehensive metabolic panel     Status: Abnormal   Collection Time: 07/02/20  3:08 AM  Result Value Ref Range   Sodium 134 (L) 135 - 145 mmol/L   Potassium 4.0 3.5 - 5.1 mmol/L   Chloride 102 98 - 111 mmol/L   CO2 22 22 - 32 mmol/L   Glucose, Bld 117 (H) 70 - 99 mg/dL    Comment: Glucose reference range applies only to samples taken after fasting for at least 8 hours.   BUN 5 (L) 6 - 20 mg/dL   Creatinine, Ser 0.10 0.61 - 1.24 mg/dL   Calcium 8.6 (L) 8.9 - 10.3 mg/dL   Total Protein 5.7 (L) 6.5 - 8.1 g/dL   Albumin 3.7 3.5 - 5.0 g/dL   AST 44 (H) 15 - 41 U/L   ALT 47 (H) 0 - 44 U/L   Alkaline Phosphatase 44 38 - 126 U/L   Total Bilirubin 1.6 (H) 0.3 - 1.2 mg/dL   GFR, Estimated >27 >25 mL/min    Comment: (NOTE) Calculated using the CKD-EPI Creatinine Equation (2021)    Anion gap 10 5 - 15    Comment: Performed at [facility], [address], [place], [place] [address]  CBC     Status: Abnormal   Collection Time: 07/02/20  3:08 AM  Result Value Ref Range   WBC 10.4 4.0 - 10.5 K/uL   RBC 3.81 (L) 4.22 - 5.81 MIL/uL   Hemoglobin 13.0 13.0 - 17.0 g/dL   HCT 03.4 (L) 74.2 - 59.5 %   MCV 102.1 (H) 80.0 - 100.0 fL   MCH 34.1 (  H) 26.0 - 34.0 pg   MCHC 33.4 30.0 - 36.0 g/dL   RDW 25.9 (L) 56.3 - 87.5 %   Platelets 162 150 - 400 K/uL   nRBC 0.0 0.0 - 0.2 %    Comment: Performed at [facility], [address] [place], [place] [address]    DG Chest 1 View  Result Date: 07/01/2020 CLINICAL DATA:  Fall from roof EXAM: CHEST  1 VIEW COMPARISON:  CT 07/01/2020 FINDINGS: The heart size and mediastinal contours are within normal limits. Both lungs are clear. The visualized skeletal structures are unremarkable. Nondisplaced fractures identified on companion CT involving the posterior RIGHT lower ribs are not evident  by plain film. IMPRESSION: 1. Posterior RIGHT rib fracture identified on companion CT are not clearly evident. 2. No pneumothorax. Electronically Signed   By: Genevive Bi M.D.   On: 07/01/2020 16:08   DG Shoulder Right  Result Date: 07/01/2020 CLINICAL DATA:  34 year old male with fall and right shoulder pain. EXAM: RIGHT SHOULDER - 2+ VIEW COMPARISON:  None. FINDINGS: There is no acute fracture or dislocation of the right shoulder. There are fractures of the posterior as well as anterior right seventh rib. Dedicated rib series may provide better evaluation. Probable mild subcutaneous contusion lateral to the shoulder. No radiopaque foreign object or soft tissue gas. IMPRESSION: 1. No acute fracture or dislocation of the right shoulder. 2. Fractures of the posterior and anterior right seventh rib. Dedicated rib series may provide better evaluation. Electronically Signed   By: Elgie Collard M.D.   On: 07/01/2020 15:32   DG Elbow 2 Views Right  Result Date: 07/01/2020 CLINICAL DATA:  Fall from roof with elbow pain, initial encounter EXAM: RIGHT ELBOW - 1 VIEW COMPARISON:  None. FINDINGS: Transverse fracture is noted through the radial neck just below the radial head with elevation of the anterior and posterior fat pads consistent with joint effusion. No other fracture is noted. IMPRESSION: Proximal radial fracture involving the neck as described. Electronically Signed   By: Alcide Clever M.D.   On: 07/01/2020 17:26   DG Forearm Right  Result Date: 07/01/2020 CLINICAL DATA:  Fall from roof with forearm pain, initial encounter EXAM: RIGHT FOREARM - 2 VIEW COMPARISON:  None. FINDINGS: There is a fracture through the radial neck with only minimal displacement identified. No other focal abnormality is noted. IMPRESSION: Proximal radius fracture involving the neck incompletely evaluated on this exam. Electronically Signed   By: Alcide Clever M.D.   On: 07/01/2020 17:25   DG Wrist Complete  Right  Result Date: 07/01/2020 CLINICAL DATA:  Fall off roof with right wrist pain. EXAM: RIGHT WRIST - COMPLETE 3+ VIEW COMPARISON:  None. FINDINGS: There is no evidence of fracture or dislocation. There is no evidence of arthropathy or other focal bone abnormality. Soft tissues are unremarkable. IMPRESSION: Negative radiographs of the right wrist. Electronically Signed   By: Narda Rutherford M.D.   On: 07/01/2020 15:31   CT Head Wo Contrast  Result Date: 07/01/2020 CLINICAL DATA:  Head trauma, moderate/severe. Neck trauma, dangerous injury mechanism. Additional history provided: Fall off of roof. EXAM: CT HEAD WITHOUT CONTRAST CT CERVICAL SPINE WITHOUT CONTRAST TECHNIQUE: Multidetector CT imaging of the head and cervical spine was performed following the standard protocol without intravenous contrast. Multiplanar CT image reconstructions of the cervical spine were also generated. COMPARISON:  No pertinent prior exams available for comparison. FINDINGS: CT HEAD FINDINGS Brain: Cerebral volume is normal. There is no acute intracranial hemorrhage.  No demarcated cortical infarct. No extra-axial fluid collection. No evidence of intracranial mass. No midline shift. Vascular: No hyperdense vessel. Skull: Deformity of the right sphenoid bone along the lateral aspect of the right orbital apex, likely reflecting a mildly displaced acute fracture (for instance as seen on series 4, image 31) (series 5, image 25). Sinuses/Orbits: Subtle irregularity of the right orbital floor is questioned and a nondisplaced fracture is difficult to exclude. Right periorbital and maxillofacial soft tissue swelling. Acute, mildly displaced fractures of the posterolateral wall of the right maxillary sinus. Air-fluid level within the right maxillary sinus likely reflecting the presence of hemorrhage. Small volume frothy secretions, small air-fluid level and small mucous retention cyst within the right sphenoid sinus. Mild paranasal sinus  mucosal thickening elsewhere. Other: Nondisplaced acute fracture of the right maxillary hard palate (for instance as seen on series 4, image 12). CT CERVICAL SPINE FINDINGS Alignment: Mild nonspecific reversal of the expected cervical lordosis. No significant spondylolisthesis. Skull base and vertebrae: The basion-dental and atlanto-dental intervals are maintained.No evidence of acute fracture to the cervical spine. Soft tissues and spinal canal: No prevertebral fluid or swelling. No visible canal hematoma. Disc levels: No significant bony spinal canal or neural foraminal narrowing at any level. Upper chest: No visible airspace consolidation or pneumothorax. IMPRESSION: CT head: 1. No evidence of acute intracranial abnormality. 2. Mildly displaced fracture of the right sphenoid bone along the lateral aspect of the right orbital apex. 3. Acute, mildly displaced fractures of the posterolateral wall of the right maxillary sinus. Nondisplaced acute fracture through the right maxillary hard palate. Subtle irregularity of the right orbital floor is also questioned and a nondisplaced right orbital floor fracture is difficult to exclude. A dedicated maxillofacial CT is recommended for further evaluation of these maxillofacial and skull base fractures. 4. Large air-fluid level within the right maxillary sinus likely reflecting the presence of blood products. 5. A small air-fluid level is also present within the right sphenoid sinus, which may reflect secretions or blood products. 6. Right periorbital/maxillofacial hematoma. CT cervical spine: 1. No evidence of acute fracture to the cervical spine. 2. Mild nonspecific reversal of the expected cervical lordosis. Electronically Signed   By: Jackey Loge DO   On: 07/01/2020 16:15   CT CHEST W CONTRAST  Result Date: 07/01/2020 CLINICAL DATA:  Status post fall from roof approximately 9 feet high. Presenting with hip pain. EXAM: CT ABDOMEN AND PELVIS WITH CONTRAST TECHNIQUE:  Multidetector CT imaging of the abdomen and pelvis was performed using the standard protocol following bolus administration of intravenous contrast. CONTRAST:  OMNIPAQUE IOHEXOL 300 MG/ML  SOLN COMPARISON:  None. FINDINGS: CHEST: Ports and Devices: None. Lungs/airways: Bilateral lower lobe subsegmental atelectasis. Nonspecific thin walled cystic lesion within the right upper lobe (5:39). No focal consolidation. No pulmonary nodule. No pulmonary mass. No definite pulmonary contusion or laceration. No pneumatocele formation. The central airways are patent. Pleura: No pleural effusion. Trace posterior right pneumothorax (5:76). No left pneumothorax. No hemothorax. Lymph Nodes: No mediastinal, hilar, or axillary lymphadenopathy. Mediastinum: No pneumomediastinum. No aortic injury or mediastinal hematoma. The thoracic aorta is normal in caliber. The heart is normal in size. No significant pericardial effusion. The esophagus is unremarkable. The thyroid is unremarkable. Chest Wall / Breasts: No chest wall mass. Musculoskeletal: No sternal fracture. Visualized portions of the scapula demonstrate no acute displaced fracture. Nondisplaced acute fracture of the right posterior fourth through eighth ribs with the right posterior seventh rib minimally displaced (1 mm). Acute nondisplaced fracture  of the anterior right third and fourth ribs. No spinal fracture. Visualized portions of the right upper extremity suggestive of an avulsion fracture of the triquetrum. ABDOMEN / PELVIS: Liver: Not enlarged. No focal lesion. No laceration or subcapsular hematoma. Biliary System: The gallbladder is otherwise unremarkable with no radio-opaque gallstones. No biliary ductal dilatation. Pancreas: Normal pancreatic contour. No main pancreatic duct dilatation. Spleen: Not enlarged. No focal lesion. No laceration, subcapsular hematoma, or vascular injury. Adrenal Glands: No nodularity bilaterally. Kidneys: Bilateral kidneys enhance  symmetrically. No hydronephrosis. No contusion, laceration, or subcapsular hematoma. No injury to the vascular structures or collecting systems. No hydroureter. On delayed imaging, there is no urothelial wall thickening and there are no filling defects in the opacified portions of the bilateral collecting systems or ureters. The urinary bladder is unremarkable. Bowel: No small or large bowel wall thickening or dilatation. The appendix is not definitely identified. Mesentery, Omentum, and Peritoneum: No simple free fluid ascites. No pneumoperitoneum. No hemoperitoneum. No mesenteric hematoma identified. No organized fluid collection. Pelvic Organs: Normal. Lymph Nodes: No abdominal, pelvic, inguinal lymphadenopathy. Vasculature: No abdominal aorta or iliac aneurysm. No active contrast extravasation or pseudoaneurysm. Musculoskeletal: No spinal fracture. No acute pelvic fracture; however, partially visualized acute comminuted fracture right greater trochanter extending to the right femoral neck as well as the right proximal femoral shaft. Associated subcutaneus soft tissue edema of the right gluteus medius as well as the right vastus intermedius muscle. IMPRESSION: 1. Trace (sliver) posterior right pneumothorax with associated right anterior third and fourth rib fractures as well as right posterior fourth through eighth ribs fractures. All these fracture are nondisplaced other than the right posterior seventh rib that is minimally displaced (1mm). 2. Acute comminuted fracture of the right femoral greater trochanter that extends to involve the right femoral neck as well as the right proximal femoral shaft. Femoral shaft fracture only partially visualized. Associated subcutaneus soft tissue edema of the right gluteus medius as well as the right vastus intermedius muscle with intramuscular hematoma not excluded. Recommend dedicated right femur x-rays. 3. Suggestion of an avulsion fracture of the right triquetrum.  Recommend dedicated right hand x-rays. 4. No acute intra-abdominal or intrapelvic traumatic injury. 5. No acute fracture or traumatic malalignment of the thoracic or lumbar spine. These results were called by telephone at the time of interpretation on 07/01/2020 at 4:13 pm to provider Dr. Charm Barges, who verbally acknowledged these results. Electronically Signed   By: Tish Frederickson M.D.   On: 07/01/2020 16:14   CT Cervical Spine Wo Contrast  Result Date: 07/01/2020 CLINICAL DATA:  Head trauma, moderate/severe. Neck trauma, dangerous injury mechanism. Additional history provided: Fall off of roof. EXAM: CT HEAD WITHOUT CONTRAST CT CERVICAL SPINE WITHOUT CONTRAST TECHNIQUE: Multidetector CT imaging of the head and cervical spine was performed following the standard protocol without intravenous contrast. Multiplanar CT image reconstructions of the cervical spine were also generated. COMPARISON:  No pertinent prior exams available for comparison. FINDINGS: CT HEAD FINDINGS Brain: Cerebral volume is normal. There is no acute intracranial hemorrhage. No demarcated cortical infarct. No extra-axial fluid collection. No evidence of intracranial mass. No midline shift. Vascular: No hyperdense vessel. Skull: Deformity of the right sphenoid bone along the lateral aspect of the right orbital apex, likely reflecting a mildly displaced acute fracture (for instance as seen on series 4, image 31) (series 5, image 25). Sinuses/Orbits: Subtle irregularity of the right orbital floor is questioned and a nondisplaced fracture is difficult to exclude. Right periorbital and maxillofacial soft tissue swelling.  Acute, mildly displaced fractures of the posterolateral wall of the right maxillary sinus. Air-fluid level within the right maxillary sinus likely reflecting the presence of hemorrhage. Small volume frothy secretions, small air-fluid level and small mucous retention cyst within the right sphenoid sinus. Mild paranasal sinus mucosal  thickening elsewhere. Other: Nondisplaced acute fracture of the right maxillary hard palate (for instance as seen on series 4, image 12). CT CERVICAL SPINE FINDINGS Alignment: Mild nonspecific reversal of the expected cervical lordosis. No significant spondylolisthesis. Skull base and vertebrae: The basion-dental and atlanto-dental intervals are maintained.No evidence of acute fracture to the cervical spine. Soft tissues and spinal canal: No prevertebral fluid or swelling. No visible canal hematoma. Disc levels: No significant bony spinal canal or neural foraminal narrowing at any level. Upper chest: No visible airspace consolidation or pneumothorax. IMPRESSION: CT head: 1. No evidence of acute intracranial abnormality. 2. Mildly displaced fracture of the right sphenoid bone along the lateral aspect of the right orbital apex. 3. Acute, mildly displaced fractures of the posterolateral wall of the right maxillary sinus. Nondisplaced acute fracture through the right maxillary hard palate. Subtle irregularity of the right orbital floor is also questioned and a nondisplaced right orbital floor fracture is difficult to exclude. A dedicated maxillofacial CT is recommended for further evaluation of these maxillofacial and skull base fractures. 4. Large air-fluid level within the right maxillary sinus likely reflecting the presence of blood products. 5. A small air-fluid level is also present within the right sphenoid sinus, which may reflect secretions or blood products. 6. Right periorbital/maxillofacial hematoma. CT cervical spine: 1. No evidence of acute fracture to the cervical spine. 2. Mild nonspecific reversal of the expected cervical lordosis. Electronically Signed   By: Jackey Loge DO   On: 07/01/2020 16:15   CT ABDOMEN PELVIS W CONTRAST  Result Date: 07/01/2020 CLINICAL DATA:  Status post fall from roof approximately 9 feet high. Presenting with hip pain. EXAM: CT ABDOMEN AND PELVIS WITH CONTRAST TECHNIQUE:  Multidetector CT imaging of the abdomen and pelvis was performed using the standard protocol following bolus administration of intravenous contrast. CONTRAST:  OMNIPAQUE IOHEXOL 300 MG/ML  SOLN COMPARISON:  None. FINDINGS: CHEST: Ports and Devices: None. Lungs/airways: Bilateral lower lobe subsegmental atelectasis. Nonspecific thin walled cystic lesion within the right upper lobe (5:39). No focal consolidation. No pulmonary nodule. No pulmonary mass. No definite pulmonary contusion or laceration. No pneumatocele formation. The central airways are patent. Pleura: No pleural effusion. Trace posterior right pneumothorax (5:76). No left pneumothorax. No hemothorax. Lymph Nodes: No mediastinal, hilar, or axillary lymphadenopathy. Mediastinum: No pneumomediastinum. No aortic injury or mediastinal hematoma. The thoracic aorta is normal in caliber. The heart is normal in size. No significant pericardial effusion. The esophagus is unremarkable. The thyroid is unremarkable. Chest Wall / Breasts: No chest wall mass. Musculoskeletal: No sternal fracture. Visualized portions of the scapula demonstrate no acute displaced fracture. Nondisplaced acute fracture of the right posterior fourth through eighth ribs with the right posterior seventh rib minimally displaced (1 mm). Acute nondisplaced fracture of the anterior right third and fourth ribs. No spinal fracture. Visualized portions of the right upper extremity suggestive of an avulsion fracture of the triquetrum. ABDOMEN / PELVIS: Liver: Not enlarged. No focal lesion. No laceration or subcapsular hematoma. Biliary System: The gallbladder is otherwise unremarkable with no radio-opaque gallstones. No biliary ductal dilatation. Pancreas: Normal pancreatic contour. No main pancreatic duct dilatation. Spleen: Not enlarged. No focal lesion. No laceration, subcapsular hematoma, or vascular injury. Adrenal Glands: No  nodularity bilaterally. Kidneys: Bilateral kidneys enhance  symmetrically. No hydronephrosis. No contusion, laceration, or subcapsular hematoma. No injury to the vascular structures or collecting systems. No hydroureter. On delayed imaging, there is no urothelial wall thickening and there are no filling defects in the opacified portions of the bilateral collecting systems or ureters. The urinary bladder is unremarkable. Bowel: No small or large bowel wall thickening or dilatation. The appendix is not definitely identified. Mesentery, Omentum, and Peritoneum: No simple free fluid ascites. No pneumoperitoneum. No hemoperitoneum. No mesenteric hematoma identified. No organized fluid collection. Pelvic Organs: Normal. Lymph Nodes: No abdominal, pelvic, inguinal lymphadenopathy. Vasculature: No abdominal aorta or iliac aneurysm. No active contrast extravasation or pseudoaneurysm. Musculoskeletal: No spinal fracture. No acute pelvic fracture; however, partially visualized acute comminuted fracture right greater trochanter extending to the right femoral neck as well as the right proximal femoral shaft. Associated subcutaneus soft tissue edema of the right gluteus medius as well as the right vastus intermedius muscle. IMPRESSION: 1. Trace (sliver) posterior right pneumothorax with associated right anterior third and fourth rib fractures as well as right posterior fourth through eighth ribs fractures. All these fracture are nondisplaced other than the right posterior seventh rib that is minimally displaced (1mm). 2. Acute comminuted fracture of the right femoral greater trochanter that extends to involve the right femoral neck as well as the right proximal femoral shaft. Femoral shaft fracture only partially visualized. Associated subcutaneus soft tissue edema of the right gluteus medius as well as the right vastus intermedius muscle with intramuscular hematoma not excluded. Recommend dedicated right femur x-rays. 3. Suggestion of an avulsion fracture of the right triquetrum.  Recommend dedicated right hand x-rays. 4. No acute intra-abdominal or intrapelvic traumatic injury. 5. No acute fracture or traumatic malalignment of the thoracic or lumbar spine. These results were called by telephone at the time of interpretation on 07/01/2020 at 4:13 pm to provider Dr. Charm Barges, who verbally acknowledged these results. Electronically Signed   By: Tish Frederickson M.D.   On: 07/01/2020 16:14   DG CHEST PORT 1 VIEW  Result Date: 07/02/2020 CLINICAL DATA:  Pain following recent fall EXAM: PORTABLE CHEST 1 VIEW COMPARISON:  Chest radiograph and chest CT July 01, 2020 FINDINGS: No appreciable pneumothorax. There is a fracture of the lateral right sixth rib, better seen on CT. Other rib fracture seen on CT are not well visualized on portable radiographic examination. Lungs are clear. Heart size and pulmonary vascular normal. No adenopathy. IMPRESSION: Known right-sided rib fractures seen on recent CT not well seen by radiography. Subtle rib fracture lateral right sixth rib noted. No pneumothorax appreciable by radiography. Lungs clear. Heart size normal. Electronically Signed   By: Bretta Bang III M.D.   On: 07/02/2020 08:45   DG Hand Complete Right  Result Date: 07/01/2020 CLINICAL DATA:  Recent fall from roof with hand pain, initial encounter EXAM: RIGHT HAND - COMPLETE 3+ VIEW COMPARISON:  None. FINDINGS: Along the posterior aspect of the carpal bones, there are several bony fragments identified likely related to mildly displaced triquetral fractures. No other focal abnormality noted. IMPRESSION: Fragments along the posterior aspect of the carpal bones consistent with triquetral fractures. Electronically Signed   By: Alcide Clever M.D.   On: 07/01/2020 17:28   DG Hip Unilat W or Wo Pelvis 2-3 Views Right  Result Date: 07/01/2020 CLINICAL DATA:  Fall off a roof. Right hip pain. EXAM: DG HIP (WITH OR WITHOUT PELVIS) 2-3V RIGHT COMPARISON:  None. FINDINGS: Mildly comminuted and  displaced  right proximal femur fracture involving the greater trochanter involving the lateral cortex and vertical extension into the subtrochanteric region. There is no involvement of the femoral neck. Femoral head remains seated. Pubic rami are intact. There is no additional fracture. IMPRESSION: Mildly comminuted and displaced right proximal femur fracture involving the greater trochanter and subtrochanteric region. Electronically Signed   By: Narda RutherfordMelanie  Sanford M.D.   On: 07/01/2020 15:30   CT Maxillofacial Wo Contrast  Result Date: 07/01/2020 CLINICAL DATA:  Facial trauma. Fall with laceration to right cheek. EXAM: CT MAXILLOFACIAL WITHOUT CONTRAST TECHNIQUE: Multidetector CT imaging of the maxillofacial structures was performed. Multiplanar CT image reconstructions were also generated. COMPARISON:  None. FINDINGS: Osseous: No acute fracture of the nasal bone, zygomatic arches, or mandibles. Slight leftward nasal septal deviation. Temporomandibular joints are congruent. The right upper central incisor appears offset and broken. Orbits: Nondisplaced right inferior orbital wall fracture. No extraocular muscle entrapment or globe injury. Left orbit and globe are intact. Sinuses: Right maxillary sinus fracture with fracture lines involving the posterior and inferior wall as well as the posterolateral wall and roof. Adjacent soft tissue air. Moderate right hemosinus. Small fluid levels in the right side of sphenoid sinus without sphenoid sinus fracture. Mild mucosal thickening of left maxillary and right frontal sinus. Soft tissues: Soft tissue edema overlies the right cheek. No radiopaque foreign body. Limited intracranial: No significant or unexpected finding. IMPRESSION: 1. Nondisplaced right inferior orbital wall fracture. No extraocular muscle entrapment or globe injury. 2. Right maxillary sinus fracture with fracture lines involving the posterior and inferior wall as well as the posterolateral wall and  roof. Moderate right hemosinus. 3. The right upper central incisor appears offset and broken. Electronically Signed   By: Narda RutherfordMelanie  Sanford M.D.   On: 07/01/2020 17:05    ZOX:[id number]:Negative except as listed in admit H&P  Blood pressure 137/84, pulse 93, temperature 98.5 F (36.9 C), temperature source Oral, resp. rate 15, height 6\' 1"  (1.854 m), weight 74.4 kg, SpO2 95 %.  PHYSICAL EXAM: Overall appearance:  Healthy appearing, in no distress Head:  Normocephalic, atraumatic. Face: 3 cm abrasion along the right zygomatic skin area.  No palpable step-off of the orbital rims nasal bones or zygomatic arches.  No palpable abnormality of the mandible. Ears: External ears look healthy. Nose: External nose is healthy in appearance. Internal nasal exam free of any lesions or obstruction. Oral Cavity/Pharynx:  There are no mucosal lesions or masses identified. Larynx/Hypopharynx: Deferred Neuro:  No identifiable neurologic deficits. Neck: No palpable neck masses.  Studies Reviewed: Maxillofacial CT  Procedures: none   Assessment/Plan: Nondisplaced orbital wall fractures, without globe injury or entrapment.  Additional maxillary sinus fractures minimally displaced.  No need for surgical intervention.  Recommend he avoid nose blowing for the next 3 weeks.  Serena ColonelJefry Kimberlynn Lumbra 07/02/2020, 9:51 AM

## 2020-07-02 NOTE — ED Notes (Signed)
PA at bedside talking with patient. SO also at bedside.

## 2020-07-03 MED ORDER — METHOCARBAMOL 750 MG PO TABS
750.0000 mg | ORAL_TABLET | Freq: Four times a day (QID) | ORAL | Status: DC
  Administered 2020-07-03 – 2020-07-04 (×6): 750 mg via ORAL
  Filled 2020-07-03 (×6): qty 1

## 2020-07-03 MED ORDER — MORPHINE SULFATE (PF) 2 MG/ML IV SOLN
2.0000 mg | INTRAVENOUS | Status: DC | PRN
Start: 2020-07-03 — End: 2020-07-04
  Filled 2020-07-03: qty 1

## 2020-07-03 NOTE — Plan of Care (Signed)
  Problem: Education: Goal: Knowledge of General Education information will improve Description: Including pain rating scale, medication(s)/side effects and non-pharmacologic comfort measures Outcome: Progressing   Problem: Health Behavior/Discharge Planning: Goal: Ability to manage health-related needs will improve Outcome: Progressing   Problem: Clinical Measurements: Goal: Ability to maintain clinical measurements within normal limits will improve Outcome: Progressing   Problem: Activity: Goal: Risk for activity intolerance will decrease Outcome: Progressing   Problem: Pain Managment: Goal: General experience of comfort will improve Outcome: Progressing   Problem: Safety: Goal: Ability to remain free from injury will improve Outcome: Progressing   Problem: Skin Integrity: Goal: Risk for impaired skin integrity will decrease Outcome: Progressing   Problem: Activity: Goal: Ability to ambulate and perform ADLs will improve Outcome: Progressing

## 2020-07-03 NOTE — Progress Notes (Signed)
Patient refused blood draw this morning.  Day shift RN made aware and to call Phlebotomy when patient wants blood draw.

## 2020-07-03 NOTE — Progress Notes (Signed)
Subjective: Patient sitting up in chair. Patient states pain in right hip was moderate-severe when working with PT but has since decreased to mild with help of medication. Denies pain at right forearm or hand though says he feels like his right hand is very weak.   Objective:  PE: VITALS:   Vitals:   07/02/20 2002 07/02/20 2127 07/03/20 0622 07/03/20 0942  BP: 137/86 134/83 130/86 115/70  Pulse: (!) 117 (!) 105 94 74  Resp:  20 18 16   Temp: 99.9 F (37.7 C) 98.7 F (37.1 C) 99.1 F (37.3 C) 98.6 F (37 C)  TempSrc: Oral Oral Oral Oral  SpO2: 97% 94% 94% 99%  Weight:      Height:       General: sitting up in chair, in no acute distress. Resp: no increased work of breathing Abd: soft, non-tender MSK: RLE - dressings intact with no drainage. Compartment soft and compressible. Dorsiflexion and plantarflexion intact. Able to move all toes. Distal sensation intact. Foot warm and well perfused.  RUE - splint in place. Full AROM at right elbow without pain. Able to flex, extend, and abduct all fingers of right hand. Sensation intact to all aspects of hand. 3/5 grip strength. + radial pulse. No significant edema to hand or wrist.  LABS  No results found for this or any previous visit (from the past 24 hour(s)).  DG Chest 1 View  Result Date: 07/01/2020 CLINICAL DATA:  Fall from roof EXAM: CHEST  1 VIEW COMPARISON:  CT 07/01/2020 FINDINGS: The heart size and mediastinal contours are within normal limits. Both lungs are clear. The visualized skeletal structures are unremarkable. Nondisplaced fractures identified on companion CT involving the posterior RIGHT lower ribs are not evident by plain film. IMPRESSION: 1. Posterior RIGHT rib fracture identified on companion CT are not clearly evident. 2. No pneumothorax. Electronically Signed   By: 07/03/2020 M.D.   On: 07/01/2020 16:08   DG Shoulder Right  Result Date: 07/01/2020 CLINICAL DATA:  34 year old male with fall and  right shoulder pain. EXAM: RIGHT SHOULDER - 2+ VIEW COMPARISON:  None. FINDINGS: There is no acute fracture or dislocation of the right shoulder. There are fractures of the posterior as well as anterior right seventh rib. Dedicated rib series may provide better evaluation. Probable mild subcutaneous contusion lateral to the shoulder. No radiopaque foreign object or soft tissue gas. IMPRESSION: 1. No acute fracture or dislocation of the right shoulder. 2. Fractures of the posterior and anterior right seventh rib. Dedicated rib series may provide better evaluation. Electronically Signed   By: 35 M.D.   On: 07/01/2020 15:32   DG Elbow 2 Views Right  Result Date: 07/01/2020 CLINICAL DATA:  Fall from roof with elbow pain, initial encounter EXAM: RIGHT ELBOW - 1 VIEW COMPARISON:  None. FINDINGS: Transverse fracture is noted through the radial neck just below the radial head with elevation of the anterior and posterior fat pads consistent with joint effusion. No other fracture is noted. IMPRESSION: Proximal radial fracture involving the neck as described. Electronically Signed   By: 07/03/2020 M.D.   On: 07/01/2020 17:26   DG Forearm Right  Result Date: 07/01/2020 CLINICAL DATA:  Fall from roof with forearm pain, initial encounter EXAM: RIGHT FOREARM - 2 VIEW COMPARISON:  None. FINDINGS: There is a fracture through the radial neck with only minimal displacement identified. No other focal abnormality is noted. IMPRESSION: Proximal radius fracture involving the neck incompletely evaluated on this  exam. Electronically Signed   By: Alcide Clever M.D.   On: 07/01/2020 17:25   DG Wrist Complete Right  Result Date: 07/01/2020 CLINICAL DATA:  Fall off roof with right wrist pain. EXAM: RIGHT WRIST - COMPLETE 3+ VIEW COMPARISON:  None. FINDINGS: There is no evidence of fracture or dislocation. There is no evidence of arthropathy or other focal bone abnormality. Soft tissues are unremarkable. IMPRESSION:  Negative radiographs of the right wrist. Electronically Signed   By: Narda Rutherford M.D.   On: 07/01/2020 15:31   CT Head Wo Contrast  Result Date: 07/01/2020 CLINICAL DATA:  Head trauma, moderate/severe. Neck trauma, dangerous injury mechanism. Additional history provided: Fall off of roof. EXAM: CT HEAD WITHOUT CONTRAST CT CERVICAL SPINE WITHOUT CONTRAST TECHNIQUE: Multidetector CT imaging of the head and cervical spine was performed following the standard protocol without intravenous contrast. Multiplanar CT image reconstructions of the cervical spine were also generated. COMPARISON:  No pertinent prior exams available for comparison. FINDINGS: CT HEAD FINDINGS Brain: Cerebral volume is normal. There is no acute intracranial hemorrhage. No demarcated cortical infarct. No extra-axial fluid collection. No evidence of intracranial mass. No midline shift. Vascular: No hyperdense vessel. Skull: Deformity of the right sphenoid bone along the lateral aspect of the right orbital apex, likely reflecting a mildly displaced acute fracture (for instance as seen on series 4, image 31) (series 5, image 25). Sinuses/Orbits: Subtle irregularity of the right orbital floor is questioned and a nondisplaced fracture is difficult to exclude. Right periorbital and maxillofacial soft tissue swelling. Acute, mildly displaced fractures of the posterolateral wall of the right maxillary sinus. Air-fluid level within the right maxillary sinus likely reflecting the presence of hemorrhage. Small volume frothy secretions, small air-fluid level and small mucous retention cyst within the right sphenoid sinus. Mild paranasal sinus mucosal thickening elsewhere. Other: Nondisplaced acute fracture of the right maxillary hard palate (for instance as seen on series 4, image 12). CT CERVICAL SPINE FINDINGS Alignment: Mild nonspecific reversal of the expected cervical lordosis. No significant spondylolisthesis. Skull base and vertebrae: The  basion-dental and atlanto-dental intervals are maintained.No evidence of acute fracture to the cervical spine. Soft tissues and spinal canal: No prevertebral fluid or swelling. No visible canal hematoma. Disc levels: No significant bony spinal canal or neural foraminal narrowing at any level. Upper chest: No visible airspace consolidation or pneumothorax. IMPRESSION: CT head: 1. No evidence of acute intracranial abnormality. 2. Mildly displaced fracture of the right sphenoid bone along the lateral aspect of the right orbital apex. 3. Acute, mildly displaced fractures of the posterolateral wall of the right maxillary sinus. Nondisplaced acute fracture through the right maxillary hard palate. Subtle irregularity of the right orbital floor is also questioned and a nondisplaced right orbital floor fracture is difficult to exclude. A dedicated maxillofacial CT is recommended for further evaluation of these maxillofacial and skull base fractures. 4. Large air-fluid level within the right maxillary sinus likely reflecting the presence of blood products. 5. A small air-fluid level is also present within the right sphenoid sinus, which may reflect secretions or blood products. 6. Right periorbital/maxillofacial hematoma. CT cervical spine: 1. No evidence of acute fracture to the cervical spine. 2. Mild nonspecific reversal of the expected cervical lordosis. Electronically Signed   By: Jackey Loge DO   On: 07/01/2020 16:15   CT CHEST W CONTRAST  Result Date: 07/01/2020 CLINICAL DATA:  Status post fall from roof approximately 9 feet high. Presenting with hip pain. EXAM: CT ABDOMEN AND PELVIS WITH  CONTRAST TECHNIQUE: Multidetector CT imaging of the abdomen and pelvis was performed using the standard protocol following bolus administration of intravenous contrast. CONTRAST:  OMNIPAQUE IOHEXOL 300 MG/ML  SOLN COMPARISON:  None. FINDINGS: CHEST: Ports and Devices: None. Lungs/airways: Bilateral lower lobe subsegmental  atelectasis. Nonspecific thin walled cystic lesion within the right upper lobe (5:39). No focal consolidation. No pulmonary nodule. No pulmonary mass. No definite pulmonary contusion or laceration. No pneumatocele formation. The central airways are patent. Pleura: No pleural effusion. Trace posterior right pneumothorax (5:76). No left pneumothorax. No hemothorax. Lymph Nodes: No mediastinal, hilar, or axillary lymphadenopathy. Mediastinum: No pneumomediastinum. No aortic injury or mediastinal hematoma. The thoracic aorta is normal in caliber. The heart is normal in size. No significant pericardial effusion. The esophagus is unremarkable. The thyroid is unremarkable. Chest Wall / Breasts: No chest wall mass. Musculoskeletal: No sternal fracture. Visualized portions of the scapula demonstrate no acute displaced fracture. Nondisplaced acute fracture of the right posterior fourth through eighth ribs with the right posterior seventh rib minimally displaced (1 mm). Acute nondisplaced fracture of the anterior right third and fourth ribs. No spinal fracture. Visualized portions of the right upper extremity suggestive of an avulsion fracture of the triquetrum. ABDOMEN / PELVIS: Liver: Not enlarged. No focal lesion. No laceration or subcapsular hematoma. Biliary System: The gallbladder is otherwise unremarkable with no radio-opaque gallstones. No biliary ductal dilatation. Pancreas: Normal pancreatic contour. No main pancreatic duct dilatation. Spleen: Not enlarged. No focal lesion. No laceration, subcapsular hematoma, or vascular injury. Adrenal Glands: No nodularity bilaterally. Kidneys: Bilateral kidneys enhance symmetrically. No hydronephrosis. No contusion, laceration, or subcapsular hematoma. No injury to the vascular structures or collecting systems. No hydroureter. On delayed imaging, there is no urothelial wall thickening and there are no filling defects in the opacified portions of the bilateral collecting systems  or ureters. The urinary bladder is unremarkable. Bowel: No small or large bowel wall thickening or dilatation. The appendix is not definitely identified. Mesentery, Omentum, and Peritoneum: No simple free fluid ascites. No pneumoperitoneum. No hemoperitoneum. No mesenteric hematoma identified. No organized fluid collection. Pelvic Organs: Normal. Lymph Nodes: No abdominal, pelvic, inguinal lymphadenopathy. Vasculature: No abdominal aorta or iliac aneurysm. No active contrast extravasation or pseudoaneurysm. Musculoskeletal: No spinal fracture. No acute pelvic fracture; however, partially visualized acute comminuted fracture right greater trochanter extending to the right femoral neck as well as the right proximal femoral shaft. Associated subcutaneus soft tissue edema of the right gluteus medius as well as the right vastus intermedius muscle. IMPRESSION: 1. Trace (sliver) posterior right pneumothorax with associated right anterior third and fourth rib fractures as well as right posterior fourth through eighth ribs fractures. All these fracture are nondisplaced other than the right posterior seventh rib that is minimally displaced (1mm). 2. Acute comminuted fracture of the right femoral greater trochanter that extends to involve the right femoral neck as well as the right proximal femoral shaft. Femoral shaft fracture only partially visualized. Associated subcutaneus soft tissue edema of the right gluteus medius as well as the right vastus intermedius muscle with intramuscular hematoma not excluded. Recommend dedicated right femur x-rays. 3. Suggestion of an avulsion fracture of the right triquetrum. Recommend dedicated right hand x-rays. 4. No acute intra-abdominal or intrapelvic traumatic injury. 5. No acute fracture or traumatic malalignment of the thoracic or lumbar spine. These results were called by telephone at the time of interpretation on 07/01/2020 at 4:13 pm to provider Dr. Charm Barges, who verbally  acknowledged these results. Electronically Signed   By:  Tish Frederickson M.D.   On: 07/01/2020 16:14   CT Cervical Spine Wo Contrast  Result Date: 07/01/2020 CLINICAL DATA:  Head trauma, moderate/severe. Neck trauma, dangerous injury mechanism. Additional history provided: Fall off of roof. EXAM: CT HEAD WITHOUT CONTRAST CT CERVICAL SPINE WITHOUT CONTRAST TECHNIQUE: Multidetector CT imaging of the head and cervical spine was performed following the standard protocol without intravenous contrast. Multiplanar CT image reconstructions of the cervical spine were also generated. COMPARISON:  No pertinent prior exams available for comparison. FINDINGS: CT HEAD FINDINGS Brain: Cerebral volume is normal. There is no acute intracranial hemorrhage. No demarcated cortical infarct. No extra-axial fluid collection. No evidence of intracranial mass. No midline shift. Vascular: No hyperdense vessel. Skull: Deformity of the right sphenoid bone along the lateral aspect of the right orbital apex, likely reflecting a mildly displaced acute fracture (for instance as seen on series 4, image 31) (series 5, image 25). Sinuses/Orbits: Subtle irregularity of the right orbital floor is questioned and a nondisplaced fracture is difficult to exclude. Right periorbital and maxillofacial soft tissue swelling. Acute, mildly displaced fractures of the posterolateral wall of the right maxillary sinus. Air-fluid level within the right maxillary sinus likely reflecting the presence of hemorrhage. Small volume frothy secretions, small air-fluid level and small mucous retention cyst within the right sphenoid sinus. Mild paranasal sinus mucosal thickening elsewhere. Other: Nondisplaced acute fracture of the right maxillary hard palate (for instance as seen on series 4, image 12). CT CERVICAL SPINE FINDINGS Alignment: Mild nonspecific reversal of the expected cervical lordosis. No significant spondylolisthesis. Skull base and vertebrae: The  basion-dental and atlanto-dental intervals are maintained.No evidence of acute fracture to the cervical spine. Soft tissues and spinal canal: No prevertebral fluid or swelling. No visible canal hematoma. Disc levels: No significant bony spinal canal or neural foraminal narrowing at any level. Upper chest: No visible airspace consolidation or pneumothorax. IMPRESSION: CT head: 1. No evidence of acute intracranial abnormality. 2. Mildly displaced fracture of the right sphenoid bone along the lateral aspect of the right orbital apex. 3. Acute, mildly displaced fractures of the posterolateral wall of the right maxillary sinus. Nondisplaced acute fracture through the right maxillary hard palate. Subtle irregularity of the right orbital floor is also questioned and a nondisplaced right orbital floor fracture is difficult to exclude. A dedicated maxillofacial CT is recommended for further evaluation of these maxillofacial and skull base fractures. 4. Large air-fluid level within the right maxillary sinus likely reflecting the presence of blood products. 5. A small air-fluid level is also present within the right sphenoid sinus, which may reflect secretions or blood products. 6. Right periorbital/maxillofacial hematoma. CT cervical spine: 1. No evidence of acute fracture to the cervical spine. 2. Mild nonspecific reversal of the expected cervical lordosis. Electronically Signed   By: Jackey Loge DO   On: 07/01/2020 16:15   CT ABDOMEN PELVIS W CONTRAST  Result Date: 07/01/2020 CLINICAL DATA:  Status post fall from roof approximately 9 feet high. Presenting with hip pain. EXAM: CT ABDOMEN AND PELVIS WITH CONTRAST TECHNIQUE: Multidetector CT imaging of the abdomen and pelvis was performed using the standard protocol following bolus administration of intravenous contrast. CONTRAST:  OMNIPAQUE IOHEXOL 300 MG/ML  SOLN COMPARISON:  None. FINDINGS: CHEST: Ports and Devices: None. Lungs/airways: Bilateral lower lobe  subsegmental atelectasis. Nonspecific thin walled cystic lesion within the right upper lobe (5:39). No focal consolidation. No pulmonary nodule. No pulmonary mass. No definite pulmonary contusion or laceration. No pneumatocele formation. The central airways are  patent. Pleura: No pleural effusion. Trace posterior right pneumothorax (5:76). No left pneumothorax. No hemothorax. Lymph Nodes: No mediastinal, hilar, or axillary lymphadenopathy. Mediastinum: No pneumomediastinum. No aortic injury or mediastinal hematoma. The thoracic aorta is normal in caliber. The heart is normal in size. No significant pericardial effusion. The esophagus is unremarkable. The thyroid is unremarkable. Chest Wall / Breasts: No chest wall mass. Musculoskeletal: No sternal fracture. Visualized portions of the scapula demonstrate no acute displaced fracture. Nondisplaced acute fracture of the right posterior fourth through eighth ribs with the right posterior seventh rib minimally displaced (1 mm). Acute nondisplaced fracture of the anterior right third and fourth ribs. No spinal fracture. Visualized portions of the right upper extremity suggestive of an avulsion fracture of the triquetrum. ABDOMEN / PELVIS: Liver: Not enlarged. No focal lesion. No laceration or subcapsular hematoma. Biliary System: The gallbladder is otherwise unremarkable with no radio-opaque gallstones. No biliary ductal dilatation. Pancreas: Normal pancreatic contour. No main pancreatic duct dilatation. Spleen: Not enlarged. No focal lesion. No laceration, subcapsular hematoma, or vascular injury. Adrenal Glands: No nodularity bilaterally. Kidneys: Bilateral kidneys enhance symmetrically. No hydronephrosis. No contusion, laceration, or subcapsular hematoma. No injury to the vascular structures or collecting systems. No hydroureter. On delayed imaging, there is no urothelial wall thickening and there are no filling defects in the opacified portions of the bilateral  collecting systems or ureters. The urinary bladder is unremarkable. Bowel: No small or large bowel wall thickening or dilatation. The appendix is not definitely identified. Mesentery, Omentum, and Peritoneum: No simple free fluid ascites. No pneumoperitoneum. No hemoperitoneum. No mesenteric hematoma identified. No organized fluid collection. Pelvic Organs: Normal. Lymph Nodes: No abdominal, pelvic, inguinal lymphadenopathy. Vasculature: No abdominal aorta or iliac aneurysm. No active contrast extravasation or pseudoaneurysm. Musculoskeletal: No spinal fracture. No acute pelvic fracture; however, partially visualized acute comminuted fracture right greater trochanter extending to the right femoral neck as well as the right proximal femoral shaft. Associated subcutaneus soft tissue edema of the right gluteus medius as well as the right vastus intermedius muscle. IMPRESSION: 1. Trace (sliver) posterior right pneumothorax with associated right anterior third and fourth rib fractures as well as right posterior fourth through eighth ribs fractures. All these fracture are nondisplaced other than the right posterior seventh rib that is minimally displaced (29mm). 2. Acute comminuted fracture of the right femoral greater trochanter that extends to involve the right femoral neck as well as the right proximal femoral shaft. Femoral shaft fracture only partially visualized. Associated subcutaneus soft tissue edema of the right gluteus medius as well as the right vastus intermedius muscle with intramuscular hematoma not excluded. Recommend dedicated right femur x-rays. 3. Suggestion of an avulsion fracture of the right triquetrum. Recommend dedicated right hand x-rays. 4. No acute intra-abdominal or intrapelvic traumatic injury. 5. No acute fracture or traumatic malalignment of the thoracic or lumbar spine. These results were called by telephone at the time of interpretation on 07/01/2020 at 4:13 pm to provider Dr. Charm Barges, who  verbally acknowledged these results. Electronically Signed   By: Tish Frederickson M.D.   On: 07/01/2020 16:14   DG CHEST PORT 1 VIEW  Result Date: 07/02/2020 CLINICAL DATA:  Pain following recent fall EXAM: PORTABLE CHEST 1 VIEW COMPARISON:  Chest radiograph and chest CT July 01, 2020 FINDINGS: No appreciable pneumothorax. There is a fracture of the lateral right sixth rib, better seen on CT. Other rib fracture seen on CT are not well visualized on portable radiographic examination. Lungs are clear. Heart size and  pulmonary vascular normal. No adenopathy. IMPRESSION: Known right-sided rib fractures seen on recent CT not well seen by radiography. Subtle rib fracture lateral right sixth rib noted. No pneumothorax appreciable by radiography. Lungs clear. Heart size normal. Electronically Signed   By: Bretta BangWilliam  Woodruff III M.D.   On: 07/02/2020 08:45   DG Hand Complete Right  Result Date: 07/01/2020 CLINICAL DATA:  Recent fall from roof with hand pain, initial encounter EXAM: RIGHT HAND - COMPLETE 3+ VIEW COMPARISON:  None. FINDINGS: Along the posterior aspect of the carpal bones, there are several bony fragments identified likely related to mildly displaced triquetral fractures. No other focal abnormality noted. IMPRESSION: Fragments along the posterior aspect of the carpal bones consistent with triquetral fractures. Electronically Signed   By: Alcide CleverMark  Lukens M.D.   On: 07/01/2020 17:28   DG C-Arm 1-60 Min  Result Date: 07/02/2020 CLINICAL DATA:  Right proximal femur fracture. EXAM: RIGHT FEMUR 2 VIEWS; DG C-ARM 1-60 MIN Radiation exposure index: 13.62 mGy. COMPARISON:  July 01, 2020. FINDINGS: Five intraoperative fluoroscopic images were obtained of the right femur. These images demonstrate intramedullary rod fixation of proximal right femoral fracture. Good alignment of fracture components is noted. IMPRESSION: Fluoroscopic guidance provided during surgical internal fixation of proximal right  femoral fracture. Electronically Signed   By: Lupita RaiderJames  Green Jr M.D.   On: 07/02/2020 12:25   DG Hip Unilat W or Wo Pelvis 2-3 Views Right  Result Date: 07/01/2020 CLINICAL DATA:  Fall off a roof. Right hip pain. EXAM: DG HIP (WITH OR WITHOUT PELVIS) 2-3V RIGHT COMPARISON:  None. FINDINGS: Mildly comminuted and displaced right proximal femur fracture involving the greater trochanter involving the lateral cortex and vertical extension into the subtrochanteric region. There is no involvement of the femoral neck. Femoral head remains seated. Pubic rami are intact. There is no additional fracture. IMPRESSION: Mildly comminuted and displaced right proximal femur fracture involving the greater trochanter and subtrochanteric region. Electronically Signed   By: Narda RutherfordMelanie  Sanford M.D.   On: 07/01/2020 15:30   DG FEMUR, MIN 2 VIEWS RIGHT  Result Date: 07/02/2020 CLINICAL DATA:  Right proximal femur fracture. EXAM: RIGHT FEMUR 2 VIEWS; DG C-ARM 1-60 MIN Radiation exposure index: 13.62 mGy. COMPARISON:  July 01, 2020. FINDINGS: Five intraoperative fluoroscopic images were obtained of the right femur. These images demonstrate intramedullary rod fixation of proximal right femoral fracture. Good alignment of fracture components is noted. IMPRESSION: Fluoroscopic guidance provided during surgical internal fixation of proximal right femoral fracture. Electronically Signed   By: Lupita RaiderJames  Green Jr M.D.   On: 07/02/2020 12:25   DG FEMUR PORT, MIN 2 VIEWS RIGHT  Result Date: 07/02/2020 CLINICAL DATA:  Known right femoral fracture EXAM: RIGHT FEMUR PORTABLE 2 VIEW COMPARISON:  07/01/2020, 07/02/2020 FINDINGS: Medullary rod and proximal fixation screws are noted similar to that seen on prior intraoperative films. Distal fixation screws in the femur are noted as well. Fracture fragments are in near anatomic alignment. IMPRESSION: Status post ORIF of right femoral fracture. Electronically Signed   By: Alcide CleverMark  Lukens M.D.   On:  07/02/2020 16:21   CT Maxillofacial Wo Contrast  Result Date: 07/01/2020 CLINICAL DATA:  Facial trauma. Fall with laceration to right cheek. EXAM: CT MAXILLOFACIAL WITHOUT CONTRAST TECHNIQUE: Multidetector CT imaging of the maxillofacial structures was performed. Multiplanar CT image reconstructions were also generated. COMPARISON:  None. FINDINGS: Osseous: No acute fracture of the nasal bone, zygomatic arches, or mandibles. Slight leftward nasal septal deviation. Temporomandibular joints are congruent. The right upper central  incisor appears offset and broken. Orbits: Nondisplaced right inferior orbital wall fracture. No extraocular muscle entrapment or globe injury. Left orbit and globe are intact. Sinuses: Right maxillary sinus fracture with fracture lines involving the posterior and inferior wall as well as the posterolateral wall and roof. Adjacent soft tissue air. Moderate right hemosinus. Small fluid levels in the right side of sphenoid sinus without sphenoid sinus fracture. Mild mucosal thickening of left maxillary and right frontal sinus. Soft tissues: Soft tissue edema overlies the right cheek. No radiopaque foreign body. Limited intracranial: No significant or unexpected finding. IMPRESSION: 1. Nondisplaced right inferior orbital wall fracture. No extraocular muscle entrapment or globe injury. 2. Right maxillary sinus fracture with fracture lines involving the posterior and inferior wall as well as the posterolateral wall and roof. Moderate right hemosinus. 3. The right upper central incisor appears offset and broken. Electronically Signed   By: Narda Rutherford M.D.   On: 07/01/2020 17:05    Assessment/Plan: Right proximal femur fracture, right radial neck fracture, right triquetral avulsion fracture after fall - Post op Day 1 - cephalomedullary nailing of right femur fracture, closed treatment of right triquetrum and right radius fractures - Weightbearing: WBAT RLE and RUE, sling for comfort,  ROM as tolerated with RUE - may need to be weightbearing through a platform walker if he is unable to tolerate force through his forearm Insicional and dressing care: will change dressing tomorrow, CDI today Orthopedic device(s): removable wrist splint and sling for the RUE as needed VTE prophylaxis: lovenox, SCD's Pain control: tylenol, oxycodone, morphine, robaxin was increased per truama this am Dispo: pending PT and OT eval, ok to d/c from ortho standpoint when cleared by trauma/therapies  Contact information:   Weekdays 8-5 Janine Ores, PA-C [phone] A fter hours and holidays please check [email or web address] for group call information for [facility]  Armida Sans 07/03/2020, 11:58 AM

## 2020-07-03 NOTE — Evaluation (Signed)
Occupational Therapy Evaluation Patient Details Name: Cory Griffin. MRN: [id number] DOB: 1986-01-20 Today's Date: 07/03/2020    History of Present Illness Patient is a 34 year old male S/P fall from a roof. he suffered a right wrist fx with closed mangement, right rib fx, and a right ORIF. He underwent a right femur ORIF on 07/12/2020. PMH:No known medical history   Clinical Impression   Patient admitted with the above diagnosis and procedure.  OT discussed edema management for R hand, demo'd fist pumps, wrist AROM within tolerance, sup/pro within tolerance, elbow AROM and shoulder AAROM all to R upper extremity.  Issued patient a reacher, foam for utensils, and sponge due to no insurance.  Patient was completely independent prior to his accident.  Deficits impacting function are listed below.  Currently he is needing Min Guard and increased time for mobility in platform walker.  Min A for sit to stand, Mod A for lower body dressing and cues for rolling walker sequencing.  He is hoping to discharge tomorrow, but OT will follow in the acute setting.      Follow Up Recommendations  No OT follow up    Equipment Recommendations  Tub/shower bench;3 in 1 bedside commode    Recommendations for Other Services       Precautions / Restrictions Precautions Precautions: None Restrictions Weight Bearing Restrictions: Yes RUE Weight Bearing: Weight bearing as tolerated RLE Weight Bearing: Weight bearing as tolerated Other Position/Activity Restrictions: Rib fractures to R side.      Mobility Bed Mobility Overal bed mobility: Needs Assistance Bed Mobility: Sit to Supine     Supine to sit: Min assist Sit to supine: Mod assist   General bed mobility comments: Patient required just min a to scoot but it took him time to get ot the edge of the bed. increased pain with bed mobility    Transfers Overall transfer level: Needs assistance Equipment used: Right platform walker Transfers:  Sit to/from Stand;Stand Pivot Transfers Sit to Stand: Min guard Stand pivot transfers: Min guard       General transfer comment: Patient transfered sit to stand 3x with therapy. He required less assist and cuing each trial. He had increased pain in his hip and wrist with transfers. he attmepted standard walker 2nd to rreports of forearm pain but was unable to put weight through standerd walker. he did better with platform walker    Balance Overall balance assessment: Independent;Needs assistance Sitting-balance support: Single extremity supported Sitting balance-Leahy Scale: Good     Standing balance support: Bilateral upper extremity supported Standing balance-Leahy Scale: Poor Standing balance comment: needs platform walker for balance                           ADL either performed or assessed with clinical judgement   ADL Overall ADL's : Needs assistance/impaired Eating/Feeding: Independent   Grooming: Wash/dry hands;Wash/dry face;Oral care;Set up;Sitting           Upper Body Dressing : Sitting;Set up   Lower Body Dressing: Moderate assistance;Sit to/from stand               Functional mobility during ADLs: Hydrographic surveyor     Praxis      Pertinent Vitals/Pain Pain Assessment: Faces Faces Pain Scale: Hurts whole lot Pain Location: right wrist and right hip Pain Descriptors / Indicators: Aching;Grimacing;Guarding Pain  Intervention(s): Monitored during session;Patient requesting pain meds-RN notified     Hand Dominance Right   Extremity/Trunk Assessment Upper Extremity Assessment Upper Extremity Assessment: RUE deficits/detail RUE: Unable to fully assess due to pain RUE Sensation: WNL RUE Coordination: WNL      Cervical / Trunk Assessment Cervical / Trunk Assessment: Normal   Communication Communication Communication: No difficulties   Cognition Arousal/Alertness: Awake/alert Behavior  During Therapy: WFL for tasks assessed/performed Overall Cognitive Status: Within Functional Limits for tasks assessed                                 General Comments:  (anxious to go home)   General Comments  wrist split on the right wrist; bandage on right thigh    Exercises General Exercises - Upper Extremity Shoulder Flexion: AROM;Right;10 reps Shoulder Extension: AROM;Right;10 reps Elbow Flexion: AROM;Right;10 reps Elbow Extension: AROM;Right;10 reps Wrist Flexion: AROM;Right;5 reps Wrist Extension: AROM;Right;5 reps Digit Composite Flexion: AROM;Right;10 reps Composite Extension: AROM;Right;10 reps   Shoulder Instructions      Home Living Family/patient expects to be discharged to:: Private residence Living Arrangements: Spouse/significant other Available Help at Discharge: Family Type of Home: House Home Access: Stairs to enter Secretary/administrator of Steps: 2 Entrance Stairs-Rails: None Home Layout: One level     Bathroom Shower/Tub: Chief Strategy Officer: Standard Bathroom Accessibility: Yes How Accessible: Accessible via walker Home Equipment: None   Additional Comments: lives with father and girlfriend      Prior Functioning/Environment Level of Independence: Independent        Comments: Was supposed to be starting a new job on Monday.        OT Problem List: Impaired balance (sitting and/or standing);Decreased safety awareness;Pain      OT Treatment/Interventions: Self-care/ADL training;Therapeutic exercise;DME and/or AE instruction;Balance training;Patient/family education;Therapeutic activities    OT Goals(Current goals can be found in the care plan section) Acute Rehab OT Goals Patient Stated Goal: Ease the pain a bit so I can do more. OT Goal Formulation: With patient Time For Goal Achievement: 07/17/20 ADL Goals Pt Will Perform Grooming: with set-up;standing Pt Will Perform Lower Body Bathing: with  supervision;sit to/from stand Pt Will Perform Lower Body Dressing: with supervision;sit to/from stand Pt Will Transfer to Toilet: with modified independence;ambulating  OT Frequency: Min 2X/week   Barriers to D/C:    none noted       Co-evaluation              AM-PAC OT "6 Clicks" Daily Activity     Outcome Measure Help from another person eating meals?: None Help from another person taking care of personal grooming?: A Little Help from another person toileting, which includes using toliet, bedpan, or urinal?: A Little Help from another person bathing (including washing, rinsing, drying)?: A Lot Help from another person to put on and taking off regular upper body clothing?: A Little Help from another person to put on and taking off regular lower body clothing?: A Lot 6 Click Score: 17   End of Session Equipment Utilized During Treatment: Engineer, water Communication: Patient requests pain meds  Activity Tolerance: Patient limited by pain Patient left: in bed;with call bell/phone within reach;with family/visitor present  OT Visit Diagnosis: Unsteadiness on feet (R26.81);Pain Pain - Right/Left: Right Pain - part of body: Arm;Hand;Leg                Time: 1157-2620 OT Time Calculation (  min): 21 min Charges:  OT General Charges $OT Visit: 1 Visit OT Evaluation $OT Eval Moderate Complexity: 1 Mod  07/03/2020  Rich, OTR/L  Acute Rehabilitation Services  Office:  [phone]   Suzanna Obey 07/03/2020, 2:53 PM

## 2020-07-03 NOTE — Anesthesia Postprocedure Evaluation (Signed)
Anesthesia Post Note  Patient: Emmerson Taddei.  Procedure(s) Performed: INTRAMEDULLARY (IM) NAIL INTERTROCHANTRIC (Right Leg Upper)     Patient location during evaluation: PACU Anesthesia Type: General Level of consciousness: sedated and patient cooperative Pain management: pain level controlled Vital Signs Assessment: post-procedure vital signs reviewed and stable Respiratory status: spontaneous breathing Cardiovascular status: stable Anesthetic complications: no   No complications documented.  Last Vitals:  Vitals:   07/03/20 0942 07/03/20 1321  BP: 115/70 115/75  Pulse: 74 87  Resp: 16 17  Temp: 37 C 37 C  SpO2: 99% 98%    Last Pain:  Vitals:   07/03/20 1658  TempSrc:   PainSc: Asleep                 Lewie Loron

## 2020-07-03 NOTE — Evaluation (Signed)
Physical Therapy Evaluation Patient Details Name: Cory Griffin. MRN: [id number] DOB: Oct 03, 1985 Today's Date: 07/03/2020   History of Present Illness  PAtient is a 34 year old male S/P fall from a roof. he suffered a right wrist fx with closed mangement, right rib fx, and a right ORIF. He underwent a right femur ORIF on 07/12/2020. PMH:No known medical history  Clinical Impression  Patient was limited by pain but was able to stand, take some steps, and transfer to the chair safely. He is very motivated to go home. He has a short step to a landing then the step into his house. He feels like he can use the walker to manage. He required assist for bed mobility and transfers but will have his father and girlfriend at home to assist him. He would benefit from use of a platform walker. He also has a tub shower. He may need a shower chair for home. Acute therapy will continue to follow.     Follow Up Recommendations Home health PT (may decline)    Equipment Recommendations  Rolling walker with 5" wheels;3in1 (PT) (with right platform)    Recommendations for Other Services       Precautions / Restrictions Precautions Precautions: None Restrictions Weight Bearing Restrictions: Yes RUE Weight Bearing: Weight bearing as tolerated RLE Weight Bearing: Weight bearing as tolerated      Mobility  Bed Mobility Overal bed mobility: Needs Assistance Bed Mobility: Supine to Sit     Supine to sit: Min assist     General bed mobility comments: Patient required just min a to scoot but it took him time to get ot the edge of the bed. increased pain with bed mobility    Transfers Overall transfer level: Needs assistance Equipment used: Right platform walker Transfers: Sit to/from Stand Sit to Stand: Min assist         General transfer comment: Patient transfered sit to stand 3x with therapy. He required less assist and cuing each trial. He had increased pain in his hip and wrist with  transfers. he attmepted standard walker 2nd to rreports of forearm pain but was unable to put weight through standerd walker. he did better with platform walker  Ambulation/Gait   Gait Distance (Feet): 8 Feet (2' first trial 4' 2nd trial) Assistive device: Rolling walker (2 wheeled) Gait Pattern/deviations: Step-to pattern Gait velocity: decreased Gait velocity interpretation: <1.31 ft/sec, indicative of household ambulator General Gait Details: increased pain with steps. Able to do more with practice. Limited gait distance  Stairs            Wheelchair Mobility    Modified Rankin (Stroke Patients Only)       Balance Overall balance assessment: Independent;Needs assistance Sitting-balance support: Single extremity supported Sitting balance-Leahy Scale: Good     Standing balance support: Bilateral upper extremity supported Standing balance-Leahy Scale: Poor Standing balance comment: needs platform walker for balance                             Pertinent Vitals/Pain Pain Assessment: Faces Faces Pain Scale: Hurts whole lot Pain Location: right wrist and right hip Pain Descriptors / Indicators: Aching;Grimacing;Guarding Pain Intervention(s): Limited activity within patient's tolerance;Monitored during session;Relaxation;Repositioned    Home Living Family/patient expects to be discharged to:: Private residence Living Arrangements: Spouse/significant other Available Help at Discharge: Family Type of Home: House Home Access: Stairs to enter Entrance Stairs-Rails: None (short step with landing then into  the house) Entrance Stairs-Number of Steps: 2     Additional Comments: lives with father and girlfriend    Prior Function Level of Independence: Independent         Comments: was ctive prior. Patient was on a roof when he fell     Hand Dominance   Dominant Hand: Right    Extremity/Trunk Assessment   Upper Extremity Assessment Upper Extremity  Assessment: Defer to OT evaluation    Lower Extremity Assessment Lower Extremity Assessment: RLE deficits/detail RLE Deficits / Details: limited hip flexion. Painful with most movements RLE: Unable to fully assess due to pain;Unable to fully assess due to immobilization RLE Sensation: WNL RLE Coordination: WNL    Cervical / Trunk Assessment Cervical / Trunk Assessment: Normal  Communication   Communication: No difficulties  Cognition Arousal/Alertness: Awake/alert Behavior During Therapy: WFL for tasks assessed/performed Overall Cognitive Status: Within Functional Limits for tasks assessed                                 General Comments:  (anxious to go home)      General Comments General comments (skin integrity, edema, etc.): wrist split on the right wrist; bandage on right thigh    Exercises     Assessment/Plan    PT Assessment Patient needs continued PT services  PT Problem List Decreased strength;Decreased range of motion;Decreased activity tolerance;Decreased mobility;Decreased knowledge of use of DME;Decreased knowledge of precautions       PT Treatment Interventions DME instruction;Gait training;Stair training;Functional mobility training;Therapeutic activities;Therapeutic exercise;Balance training;Patient/family education;Manual techniques    PT Goals (Current goals can be found in the Care Plan section)  Acute Rehab PT Goals Patient Stated Goal: to go home PT Goal Formulation: With patient Time For Goal Achievement:  (w+1) Potential to Achieve Goals: Good    Frequency Min 4X/week   Barriers to discharge        Co-evaluation               AM-PAC PT "6 Clicks" Mobility  Outcome Measure Help needed turning from your back to your side while in a flat bed without using bedrails?: A Little Help needed moving from lying on your back to sitting on the side of a flat bed without using bedrails?: A Little Help needed moving to and from a  bed to a chair (including a wheelchair)?: A Little Help needed standing up from a chair using your arms (e.g., wheelchair or bedside chair)?: A Little Help needed to walk in hospital room?: A Lot Help needed climbing 3-5 steps with a railing? : A Lot 6 Click Score: 16    End of Session Equipment Utilized During Treatment: Gait belt (kept lower the rib fx) Activity Tolerance: No increased pain Patient left: in chair;with call bell/phone within reach;with family/visitor present Nurse Communication: Mobility status PT Visit Diagnosis: Unsteadiness on feet (R26.81);Other abnormalities of gait and mobility (R26.89);Muscle weakness (generalized) (M62.81);Difficulty in walking, not elsewhere classified (R26.2);Pain Pain - Right/Left: Right Pain - part of body: Hip;Arm;Leg    Time: 1120-1150 PT Time Calculation (min) (ACUTE ONLY): 30 min   Charges:   PT Evaluation $PT Eval Low Complexity: 1 Low          Dessie Coma PT DPT  07/03/2020, 1:00 PM

## 2020-07-03 NOTE — Progress Notes (Signed)
[facility] Progress Note  1 Day Post-Op  Subjective: CC-  Complains of a lot of pain, mostly in the right hip. Pain medication helps but wears off too quickly. Mild pain from rib fractures when he cough. Denies SOB. Will not use IS. Tolerating diet. Denies abdominal pain. Passing flatus, no BM.  Objective: Vital signs in last 24 hours: Temp:  [98.4 F (36.9 C)-99.9 F (37.7 C)] 99.1 F (37.3 C) (12/18 0622) Pulse Rate:  [94-122] 94 (12/18 0622) Resp:  [13-24] 18 (12/18 0622) BP: (130-144)/(83-97) 130/86 (12/18 0622) SpO2:  [88 %-100 %] 94 % (12/18 0622) Weight:  [68 kg] 68 kg (12/17 1003) Last BM Date: 06/30/20  Intake/Output from previous day: 12/17 0701 - 12/18 0700 In: 4750.6 [P.O.:420; I.V.:3735.5; IV Piggyback:595.1] Out: 2300 [Urine:2250; Blood:50] Intake/Output this shift: No intake/output data recorded.  PE: Gen:  Alert, NAD HEENT: EOM's intact, pupils equal and round, abrasions to right cheek, right periorbital edema Card:  RRR, no M/G/R heard, 2+ DP pulses Pulm:  CTAB, no W/R/R, rate and effort normal on room air Abd: Soft, NT/ND, +BS, no HSM, no hernia, incisions C/D/I, drain with minimal sanguinous drainage Ext:  calves soft and nontender, cdi dressing to right lateral hip, compartments soft and compressible, splint to RUE Psych: A&Ox4  Neuro: no gross motor or sensory deficits BUE/BLE Skin: no rashes noted, warm and dry  Lab Results:  Recent Labs    07/01/20 1624 07/01/20 1641 07/02/20 0308  WBC 14.8*  --  10.4  HGB 14.5 15.3 13.0  HCT 43.4 45.0 38.9*  PLT 177  --  162   BMET Recent Labs    07/01/20 1624 07/01/20 1641 07/02/20 0308  NA 133* 135 134*  K 3.8 3.5 4.0  CL 98 102 102  CO2 23  --  22  GLUCOSE 119* 120* 117*  BUN 7 7 5*  CREATININE 0.76 0.60* 0.81  CALCIUM 8.8*  --  8.6*   PT/INR Recent Labs    07/01/20 1624  LABPROT 13.0  INR 1.0   CMP     Component Value Date/Time   NA 134 (L) 07/02/2020 0308   K 4.0  07/02/2020 0308   CL 102 07/02/2020 0308   CO2 22 07/02/2020 0308   GLUCOSE 117 (H) 07/02/2020 0308   BUN 5 (L) 07/02/2020 0308   CREATININE 0.81 07/02/2020 0308   CALCIUM 8.6 (L) 07/02/2020 0308   PROT 5.7 (L) 07/02/2020 0308   ALBUMIN 3.7 07/02/2020 0308   AST 44 (H) 07/02/2020 0308   ALT 47 (H) 07/02/2020 0308   ALKPHOS 44 07/02/2020 0308   BILITOT 1.6 (H) 07/02/2020 0308   GFRNONAA >60 07/02/2020 0308   Lipase  No results found for: LIPASE     Studies/Results: DG Chest 1 View  Result Date: 07/01/2020 CLINICAL DATA:  Fall from roof EXAM: CHEST  1 VIEW COMPARISON:  CT 07/01/2020 FINDINGS: The heart size and mediastinal contours are within normal limits. Both lungs are clear. The visualized skeletal structures are unremarkable. Nondisplaced fractures identified on companion CT involving the posterior RIGHT lower ribs are not evident by plain film. IMPRESSION: 1. Posterior RIGHT rib fracture identified on companion CT are not clearly evident. 2. No pneumothorax. Electronically Signed   By: Genevive Bi M.D.   On: 07/01/2020 16:08   DG Shoulder Right  Result Date: 07/01/2020 CLINICAL DATA:  34 year old male with fall and right shoulder pain. EXAM: RIGHT SHOULDER - 2+ VIEW COMPARISON:  None. FINDINGS: There is no acute  fracture or dislocation of the right shoulder. There are fractures of the posterior as well as anterior right seventh rib. Dedicated rib series may provide better evaluation. Probable mild subcutaneous contusion lateral to the shoulder. No radiopaque foreign object or soft tissue gas. IMPRESSION: 1. No acute fracture or dislocation of the right shoulder. 2. Fractures of the posterior and anterior right seventh rib. Dedicated rib series may provide better evaluation. Electronically Signed   By: Elgie Collard M.D.   On: 07/01/2020 15:32   DG Elbow 2 Views Right  Result Date: 07/01/2020 CLINICAL DATA:  Fall from roof with elbow pain, initial encounter EXAM: RIGHT  ELBOW - 1 VIEW COMPARISON:  None. FINDINGS: Transverse fracture is noted through the radial neck just below the radial head with elevation of the anterior and posterior fat pads consistent with joint effusion. No other fracture is noted. IMPRESSION: Proximal radial fracture involving the neck as described. Electronically Signed   By: Alcide Clever M.D.   On: 07/01/2020 17:26   DG Forearm Right  Result Date: 07/01/2020 CLINICAL DATA:  Fall from roof with forearm pain, initial encounter EXAM: RIGHT FOREARM - 2 VIEW COMPARISON:  None. FINDINGS: There is a fracture through the radial neck with only minimal displacement identified. No other focal abnormality is noted. IMPRESSION: Proximal radius fracture involving the neck incompletely evaluated on this exam. Electronically Signed   By: Alcide Clever M.D.   On: 07/01/2020 17:25   DG Wrist Complete Right  Result Date: 07/01/2020 CLINICAL DATA:  Fall off roof with right wrist pain. EXAM: RIGHT WRIST - COMPLETE 3+ VIEW COMPARISON:  None. FINDINGS: There is no evidence of fracture or dislocation. There is no evidence of arthropathy or other focal bone abnormality. Soft tissues are unremarkable. IMPRESSION: Negative radiographs of the right wrist. Electronically Signed   By: Narda Rutherford M.D.   On: 07/01/2020 15:31   CT Head Wo Contrast  Result Date: 07/01/2020 CLINICAL DATA:  Head trauma, moderate/severe. Neck trauma, dangerous injury mechanism. Additional history provided: Fall off of roof. EXAM: CT HEAD WITHOUT CONTRAST CT CERVICAL SPINE WITHOUT CONTRAST TECHNIQUE: Multidetector CT imaging of the head and cervical spine was performed following the standard protocol without intravenous contrast. Multiplanar CT image reconstructions of the cervical spine were also generated. COMPARISON:  No pertinent prior exams available for comparison. FINDINGS: CT HEAD FINDINGS Brain: Cerebral volume is normal. There is no acute intracranial hemorrhage. No demarcated  cortical infarct. No extra-axial fluid collection. No evidence of intracranial mass. No midline shift. Vascular: No hyperdense vessel. Skull: Deformity of the right sphenoid bone along the lateral aspect of the right orbital apex, likely reflecting a mildly displaced acute fracture (for instance as seen on series 4, image 31) (series 5, image 25). Sinuses/Orbits: Subtle irregularity of the right orbital floor is questioned and a nondisplaced fracture is difficult to exclude. Right periorbital and maxillofacial soft tissue swelling. Acute, mildly displaced fractures of the posterolateral wall of the right maxillary sinus. Air-fluid level within the right maxillary sinus likely reflecting the presence of hemorrhage. Small volume frothy secretions, small air-fluid level and small mucous retention cyst within the right sphenoid sinus. Mild paranasal sinus mucosal thickening elsewhere. Other: Nondisplaced acute fracture of the right maxillary hard palate (for instance as seen on series 4, image 12). CT CERVICAL SPINE FINDINGS Alignment: Mild nonspecific reversal of the expected cervical lordosis. No significant spondylolisthesis. Skull base and vertebrae: The basion-dental and atlanto-dental intervals are maintained.No evidence of acute fracture to the cervical spine. Soft  tissues and spinal canal: No prevertebral fluid or swelling. No visible canal hematoma. Disc levels: No significant bony spinal canal or neural foraminal narrowing at any level. Upper chest: No visible airspace consolidation or pneumothorax. IMPRESSION: CT head: 1. No evidence of acute intracranial abnormality. 2. Mildly displaced fracture of the right sphenoid bone along the lateral aspect of the right orbital apex. 3. Acute, mildly displaced fractures of the posterolateral wall of the right maxillary sinus. Nondisplaced acute fracture through the right maxillary hard palate. Subtle irregularity of the right orbital floor is also questioned and a  nondisplaced right orbital floor fracture is difficult to exclude. A dedicated maxillofacial CT is recommended for further evaluation of these maxillofacial and skull base fractures. 4. Large air-fluid level within the right maxillary sinus likely reflecting the presence of blood products. 5. A small air-fluid level is also present within the right sphenoid sinus, which may reflect secretions or blood products. 6. Right periorbital/maxillofacial hematoma. CT cervical spine: 1. No evidence of acute fracture to the cervical spine. 2. Mild nonspecific reversal of the expected cervical lordosis. Electronically Signed   By: Jackey Loge DO   On: 07/01/2020 16:15   CT CHEST W CONTRAST  Result Date: 07/01/2020 CLINICAL DATA:  Status post fall from roof approximately 9 feet high. Presenting with hip pain. EXAM: CT ABDOMEN AND PELVIS WITH CONTRAST TECHNIQUE: Multidetector CT imaging of the abdomen and pelvis was performed using the standard protocol following bolus administration of intravenous contrast. CONTRAST:  OMNIPAQUE IOHEXOL 300 MG/ML  SOLN COMPARISON:  None. FINDINGS: CHEST: Ports and Devices: None. Lungs/airways: Bilateral lower lobe subsegmental atelectasis. Nonspecific thin walled cystic lesion within the right upper lobe (5:39). No focal consolidation. No pulmonary nodule. No pulmonary mass. No definite pulmonary contusion or laceration. No pneumatocele formation. The central airways are patent. Pleura: No pleural effusion. Trace posterior right pneumothorax (5:76). No left pneumothorax. No hemothorax. Lymph Nodes: No mediastinal, hilar, or axillary lymphadenopathy. Mediastinum: No pneumomediastinum. No aortic injury or mediastinal hematoma. The thoracic aorta is normal in caliber. The heart is normal in size. No significant pericardial effusion. The esophagus is unremarkable. The thyroid is unremarkable. Chest Wall / Breasts: No chest wall mass. Musculoskeletal: No sternal fracture. Visualized  portions of the scapula demonstrate no acute displaced fracture. Nondisplaced acute fracture of the right posterior fourth through eighth ribs with the right posterior seventh rib minimally displaced (1 mm). Acute nondisplaced fracture of the anterior right third and fourth ribs. No spinal fracture. Visualized portions of the right upper extremity suggestive of an avulsion fracture of the triquetrum. ABDOMEN / PELVIS: Liver: Not enlarged. No focal lesion. No laceration or subcapsular hematoma. Biliary System: The gallbladder is otherwise unremarkable with no radio-opaque gallstones. No biliary ductal dilatation. Pancreas: Normal pancreatic contour. No main pancreatic duct dilatation. Spleen: Not enlarged. No focal lesion. No laceration, subcapsular hematoma, or vascular injury. Adrenal Glands: No nodularity bilaterally. Kidneys: Bilateral kidneys enhance symmetrically. No hydronephrosis. No contusion, laceration, or subcapsular hematoma. No injury to the vascular structures or collecting systems. No hydroureter. On delayed imaging, there is no urothelial wall thickening and there are no filling defects in the opacified portions of the bilateral collecting systems or ureters. The urinary bladder is unremarkable. Bowel: No small or large bowel wall thickening or dilatation. The appendix is not definitely identified. Mesentery, Omentum, and Peritoneum: No simple free fluid ascites. No pneumoperitoneum. No hemoperitoneum. No mesenteric hematoma identified. No organized fluid collection. Pelvic Organs: Normal. Lymph Nodes: No abdominal, pelvic, inguinal lymphadenopathy. Vasculature:  No abdominal aorta or iliac aneurysm. No active contrast extravasation or pseudoaneurysm. Musculoskeletal: No spinal fracture. No acute pelvic fracture; however, partially visualized acute comminuted fracture right greater trochanter extending to the right femoral neck as well as the right proximal femoral shaft. Associated subcutaneus soft  tissue edema of the right gluteus medius as well as the right vastus intermedius muscle. IMPRESSION: 1. Trace (sliver) posterior right pneumothorax with associated right anterior third and fourth rib fractures as well as right posterior fourth through eighth ribs fractures. All these fracture are nondisplaced other than the right posterior seventh rib that is minimally displaced (1mm). 2. Acute comminuted fracture of the right femoral greater trochanter that extends to involve the right femoral neck as well as the right proximal femoral shaft. Femoral shaft fracture only partially visualized. Associated subcutaneus soft tissue edema of the right gluteus medius as well as the right vastus intermedius muscle with intramuscular hematoma not excluded. Recommend dedicated right femur x-rays. 3. Suggestion of an avulsion fracture of the right triquetrum. Recommend dedicated right hand x-rays. 4. No acute intra-abdominal or intrapelvic traumatic injury. 5. No acute fracture or traumatic malalignment of the thoracic or lumbar spine. These results were called by telephone at the time of interpretation on 07/01/2020 at 4:13 pm to provider Dr. Charm Barges, who verbally acknowledged these results. Electronically Signed   By: Tish Frederickson M.D.   On: 07/01/2020 16:14   CT Cervical Spine Wo Contrast  Result Date: 07/01/2020 CLINICAL DATA:  Head trauma, moderate/severe. Neck trauma, dangerous injury mechanism. Additional history provided: Fall off of roof. EXAM: CT HEAD WITHOUT CONTRAST CT CERVICAL SPINE WITHOUT CONTRAST TECHNIQUE: Multidetector CT imaging of the head and cervical spine was performed following the standard protocol without intravenous contrast. Multiplanar CT image reconstructions of the cervical spine were also generated. COMPARISON:  No pertinent prior exams available for comparison. FINDINGS: CT HEAD FINDINGS Brain: Cerebral volume is normal. There is no acute intracranial hemorrhage. No demarcated cortical  infarct. No extra-axial fluid collection. No evidence of intracranial mass. No midline shift. Vascular: No hyperdense vessel. Skull: Deformity of the right sphenoid bone along the lateral aspect of the right orbital apex, likely reflecting a mildly displaced acute fracture (for instance as seen on series 4, image 31) (series 5, image 25). Sinuses/Orbits: Subtle irregularity of the right orbital floor is questioned and a nondisplaced fracture is difficult to exclude. Right periorbital and maxillofacial soft tissue swelling. Acute, mildly displaced fractures of the posterolateral wall of the right maxillary sinus. Air-fluid level within the right maxillary sinus likely reflecting the presence of hemorrhage. Small volume frothy secretions, small air-fluid level and small mucous retention cyst within the right sphenoid sinus. Mild paranasal sinus mucosal thickening elsewhere. Other: Nondisplaced acute fracture of the right maxillary hard palate (for instance as seen on series 4, image 12). CT CERVICAL SPINE FINDINGS Alignment: Mild nonspecific reversal of the expected cervical lordosis. No significant spondylolisthesis. Skull base and vertebrae: The basion-dental and atlanto-dental intervals are maintained.No evidence of acute fracture to the cervical spine. Soft tissues and spinal canal: No prevertebral fluid or swelling. No visible canal hematoma. Disc levels: No significant bony spinal canal or neural foraminal narrowing at any level. Upper chest: No visible airspace consolidation or pneumothorax. IMPRESSION: CT head: 1. No evidence of acute intracranial abnormality. 2. Mildly displaced fracture of the right sphenoid bone along the lateral aspect of the right orbital apex. 3. Acute, mildly displaced fractures of the posterolateral wall of the right maxillary sinus. Nondisplaced acute fracture through  the right maxillary hard palate. Subtle irregularity of the right orbital floor is also questioned and a nondisplaced  right orbital floor fracture is difficult to exclude. A dedicated maxillofacial CT is recommended for further evaluation of these maxillofacial and skull base fractures. 4. Large air-fluid level within the right maxillary sinus likely reflecting the presence of blood products. 5. A small air-fluid level is also present within the right sphenoid sinus, which may reflect secretions or blood products. 6. Right periorbital/maxillofacial hematoma. CT cervical spine: 1. No evidence of acute fracture to the cervical spine. 2. Mild nonspecific reversal of the expected cervical lordosis. Electronically Signed   By: Jackey Loge DO   On: 07/01/2020 16:15   CT ABDOMEN PELVIS W CONTRAST  Result Date: 07/01/2020 CLINICAL DATA:  Status post fall from roof approximately 9 feet high. Presenting with hip pain. EXAM: CT ABDOMEN AND PELVIS WITH CONTRAST TECHNIQUE: Multidetector CT imaging of the abdomen and pelvis was performed using the standard protocol following bolus administration of intravenous contrast. CONTRAST:  OMNIPAQUE IOHEXOL 300 MG/ML  SOLN COMPARISON:  None. FINDINGS: CHEST: Ports and Devices: None. Lungs/airways: Bilateral lower lobe subsegmental atelectasis. Nonspecific thin walled cystic lesion within the right upper lobe (5:39). No focal consolidation. No pulmonary nodule. No pulmonary mass. No definite pulmonary contusion or laceration. No pneumatocele formation. The central airways are patent. Pleura: No pleural effusion. Trace posterior right pneumothorax (5:76). No left pneumothorax. No hemothorax. Lymph Nodes: No mediastinal, hilar, or axillary lymphadenopathy. Mediastinum: No pneumomediastinum. No aortic injury or mediastinal hematoma. The thoracic aorta is normal in caliber. The heart is normal in size. No significant pericardial effusion. The esophagus is unremarkable. The thyroid is unremarkable. Chest Wall / Breasts: No chest wall mass. Musculoskeletal: No sternal fracture. Visualized portions  of the scapula demonstrate no acute displaced fracture. Nondisplaced acute fracture of the right posterior fourth through eighth ribs with the right posterior seventh rib minimally displaced (1 mm). Acute nondisplaced fracture of the anterior right third and fourth ribs. No spinal fracture. Visualized portions of the right upper extremity suggestive of an avulsion fracture of the triquetrum. ABDOMEN / PELVIS: Liver: Not enlarged. No focal lesion. No laceration or subcapsular hematoma. Biliary System: The gallbladder is otherwise unremarkable with no radio-opaque gallstones. No biliary ductal dilatation. Pancreas: Normal pancreatic contour. No main pancreatic duct dilatation. Spleen: Not enlarged. No focal lesion. No laceration, subcapsular hematoma, or vascular injury. Adrenal Glands: No nodularity bilaterally. Kidneys: Bilateral kidneys enhance symmetrically. No hydronephrosis. No contusion, laceration, or subcapsular hematoma. No injury to the vascular structures or collecting systems. No hydroureter. On delayed imaging, there is no urothelial wall thickening and there are no filling defects in the opacified portions of the bilateral collecting systems or ureters. The urinary bladder is unremarkable. Bowel: No small or large bowel wall thickening or dilatation. The appendix is not definitely identified. Mesentery, Omentum, and Peritoneum: No simple free fluid ascites. No pneumoperitoneum. No hemoperitoneum. No mesenteric hematoma identified. No organized fluid collection. Pelvic Organs: Normal. Lymph Nodes: No abdominal, pelvic, inguinal lymphadenopathy. Vasculature: No abdominal aorta or iliac aneurysm. No active contrast extravasation or pseudoaneurysm. Musculoskeletal: No spinal fracture. No acute pelvic fracture; however, partially visualized acute comminuted fracture right greater trochanter extending to the right femoral neck as well as the right proximal femoral shaft. Associated subcutaneus soft tissue  edema of the right gluteus medius as well as the right vastus intermedius muscle. IMPRESSION: 1. Trace (sliver) posterior right pneumothorax with associated right anterior third and fourth rib fractures as well  as right posterior fourth through eighth ribs fractures. All these fracture are nondisplaced other than the right posterior seventh rib that is minimally displaced (1mm). 2. Acute comminuted fracture of the right femoral greater trochanter that extends to involve the right femoral neck as well as the right proximal femoral shaft. Femoral shaft fracture only partially visualized. Associated subcutaneus soft tissue edema of the right gluteus medius as well as the right vastus intermedius muscle with intramuscular hematoma not excluded. Recommend dedicated right femur x-rays. 3. Suggestion of an avulsion fracture of the right triquetrum. Recommend dedicated right hand x-rays. 4. No acute intra-abdominal or intrapelvic traumatic injury. 5. No acute fracture or traumatic malalignment of the thoracic or lumbar spine. These results were called by telephone at the time of interpretation on 07/01/2020 at 4:13 pm to provider Dr. Charm BargesButler, who verbally acknowledged these results. Electronically Signed   By: Tish FredericksonMorgane  Naveau M.D.   On: 07/01/2020 16:14   DG CHEST PORT 1 VIEW  Result Date: 07/02/2020 CLINICAL DATA:  Pain following recent fall EXAM: PORTABLE CHEST 1 VIEW COMPARISON:  Chest radiograph and chest CT July 01, 2020 FINDINGS: No appreciable pneumothorax. There is a fracture of the lateral right sixth rib, better seen on CT. Other rib fracture seen on CT are not well visualized on portable radiographic examination. Lungs are clear. Heart size and pulmonary vascular normal. No adenopathy. IMPRESSION: Known right-sided rib fractures seen on recent CT not well seen by radiography. Subtle rib fracture lateral right sixth rib noted. No pneumothorax appreciable by radiography. Lungs clear. Heart size normal.  Electronically Signed   By: Bretta BangWilliam  Woodruff III M.D.   On: 07/02/2020 08:45   DG Hand Complete Right  Result Date: 07/01/2020 CLINICAL DATA:  Recent fall from roof with hand pain, initial encounter EXAM: RIGHT HAND - COMPLETE 3+ VIEW COMPARISON:  None. FINDINGS: Along the posterior aspect of the carpal bones, there are several bony fragments identified likely related to mildly displaced triquetral fractures. No other focal abnormality noted. IMPRESSION: Fragments along the posterior aspect of the carpal bones consistent with triquetral fractures. Electronically Signed   By: Alcide CleverMark  Lukens M.D.   On: 07/01/2020 17:28   DG C-Arm 1-60 Min  Result Date: 07/02/2020 CLINICAL DATA:  Right proximal femur fracture. EXAM: RIGHT FEMUR 2 VIEWS; DG C-ARM 1-60 MIN Radiation exposure index: 13.62 mGy. COMPARISON:  July 01, 2020. FINDINGS: Five intraoperative fluoroscopic images were obtained of the right femur. These images demonstrate intramedullary rod fixation of proximal right femoral fracture. Good alignment of fracture components is noted. IMPRESSION: Fluoroscopic guidance provided during surgical internal fixation of proximal right femoral fracture. Electronically Signed   By: Lupita RaiderJames  Green Jr M.D.   On: 07/02/2020 12:25   DG Hip Unilat W or Wo Pelvis 2-3 Views Right  Result Date: 07/01/2020 CLINICAL DATA:  Fall off a roof. Right hip pain. EXAM: DG HIP (WITH OR WITHOUT PELVIS) 2-3V RIGHT COMPARISON:  None. FINDINGS: Mildly comminuted and displaced right proximal femur fracture involving the greater trochanter involving the lateral cortex and vertical extension into the subtrochanteric region. There is no involvement of the femoral neck. Femoral head remains seated. Pubic rami are intact. There is no additional fracture. IMPRESSION: Mildly comminuted and displaced right proximal femur fracture involving the greater trochanter and subtrochanteric region. Electronically Signed   By: Narda RutherfordMelanie  Sanford M.D.    On: 07/01/2020 15:30   DG FEMUR, MIN 2 VIEWS RIGHT  Result Date: 07/02/2020 CLINICAL DATA:  Right proximal femur fracture. EXAM: RIGHT FEMUR  2 VIEWS; DG C-ARM 1-60 MIN Radiation exposure index: 13.62 mGy. COMPARISON:  July 01, 2020. FINDINGS: Five intraoperative fluoroscopic images were obtained of the right femur. These images demonstrate intramedullary rod fixation of proximal right femoral fracture. Good alignment of fracture components is noted. IMPRESSION: Fluoroscopic guidance provided during surgical internal fixation of proximal right femoral fracture. Electronically Signed   By: Lupita Raider M.D.   On: 07/02/2020 12:25   DG FEMUR PORT, MIN 2 VIEWS RIGHT  Result Date: 07/02/2020 CLINICAL DATA:  Known right femoral fracture EXAM: RIGHT FEMUR PORTABLE 2 VIEW COMPARISON:  07/01/2020, 07/02/2020 FINDINGS: Medullary rod and proximal fixation screws are noted similar to that seen on prior intraoperative films. Distal fixation screws in the femur are noted as well. Fracture fragments are in near anatomic alignment. IMPRESSION: Status post ORIF of right femoral fracture. Electronically Signed   By: Alcide Clever M.D.   On: 07/02/2020 16:21   CT Maxillofacial Wo Contrast  Result Date: 07/01/2020 CLINICAL DATA:  Facial trauma. Fall with laceration to right cheek. EXAM: CT MAXILLOFACIAL WITHOUT CONTRAST TECHNIQUE: Multidetector CT imaging of the maxillofacial structures was performed. Multiplanar CT image reconstructions were also generated. COMPARISON:  None. FINDINGS: Osseous: No acute fracture of the nasal bone, zygomatic arches, or mandibles. Slight leftward nasal septal deviation. Temporomandibular joints are congruent. The right upper central incisor appears offset and broken. Orbits: Nondisplaced right inferior orbital wall fracture. No extraocular muscle entrapment or globe injury. Left orbit and globe are intact. Sinuses: Right maxillary sinus fracture with fracture lines involving the  posterior and inferior wall as well as the posterolateral wall and roof. Adjacent soft tissue air. Moderate right hemosinus. Small fluid levels in the right side of sphenoid sinus without sphenoid sinus fracture. Mild mucosal thickening of left maxillary and right frontal sinus. Soft tissues: Soft tissue edema overlies the right cheek. No radiopaque foreign body. Limited intracranial: No significant or unexpected finding. IMPRESSION: 1. Nondisplaced right inferior orbital wall fracture. No extraocular muscle entrapment or globe injury. 2. Right maxillary sinus fracture with fracture lines involving the posterior and inferior wall as well as the posterolateral wall and roof. Moderate right hemosinus. 3. The right upper central incisor appears offset and broken. Electronically Signed   By: Narda Rutherford M.D.   On: 07/01/2020 17:05    Anti-infectives: Anti-infectives (From admission, onward)   Start     Dose/Rate Route Frequency Ordered Stop   07/02/20 1515  ceFAZolin (ANCEF) IVPB 2g/100 mL premix        2 g 200 mL/hr over 30 Minutes Intravenous Every 8 hours 07/02/20 1421 07/03/20 0649   07/02/20 1057  vancomycin (VANCOCIN) powder  Status:  Discontinued          As needed 07/02/20 1057 07/02/20 1201   07/02/20 1001  ceFAZolin (ANCEF) 2-4 GM/100ML-% IVPB       Note to Pharmacy: Sabino Niemann   : cabinet override      07/02/20 1001 07/02/20 1757   07/02/20 0930  ceFAZolin (ANCEF) IVPB 2g/100 mL premix        2 g 200 mL/hr over 30 Minutes Intravenous On call to O.R. 07/02/20 0917 07/02/20 1039       Assessment/Plan Fall off of roof Right apical PTX - resolved on follow up CXR  Right rib fx 3-8 - Multimodal pain control, pulm toilet  Right Comminuted femur/femoral neck fx - s/p IMN Dr. Jena Gauss 12/17. WBAT RLE Right radial neck fx - nonop per Dr. Jena Gauss. Sling for comfort.  WBAT RUE, may need to be weightbearing through a platform walker if he is unable to tolerate force through his forearm   Right Triquetral fx -  nonop per Dr. Jena Gauss. Removable wrist splint.  WBAT RUE Right inferior orbit wall/max sinus fx w/ right upper central incisor broken - per Dr. Pollyann Kennedy, nonop, recommend avoid nose blowing x3 weeks Elevated transaminases - continue to trend. No abdominal pain. No CT scan findings to suggest hepatobiliary injury.  Abrasions - Local wound care with bacitracin Tobacco use - Avoid nicotine replacement in setting of ortho injuries.  FEN - KVO IVF, reg diet VTE - SCDs, Loveonx ID - None Foley - none Dispo - Labs pending. PT/OT. Continue scheduled tylenol, increase scheduled robaxin. Lives at home with his GF and father in [place].     LOS: 2 days    Franne Forts, [facility] Surgery 07/03/2020, 8:53 AM Please see Amion for pager number during day hours 7:00am-4:30pm

## 2020-07-03 NOTE — Progress Notes (Addendum)
Pt continues to refuse morning blood draw and telemetry monitoring. Pt refuses lovenox. RN educated pt. Carlena Bjornstad, [place] aware. Will continue to monitor.

## 2020-07-04 LAB — CBC
HCT: 30.6 % — ABNORMAL LOW (ref 39.0–52.0)
Hemoglobin: 10.4 g/dL — ABNORMAL LOW (ref 13.0–17.0)
MCH: 33.7 pg (ref 26.0–34.0)
MCHC: 34 g/dL (ref 30.0–36.0)
MCV: 99 fL (ref 80.0–100.0)
Platelets: 142 10*3/uL — ABNORMAL LOW (ref 150–400)
RBC: 3.09 MIL/uL — ABNORMAL LOW (ref 4.22–5.81)
RDW: 11.1 % — ABNORMAL LOW (ref 11.5–15.5)
WBC: 8.1 10*3/uL (ref 4.0–10.5)
nRBC: 0 % (ref 0.0–0.2)

## 2020-07-04 MED ORDER — BACITRACIN ZINC 500 UNIT/GM EX OINT: TOPICAL_OINTMENT | Freq: Two times a day (BID) | CUTANEOUS | 0 refills | Status: DC

## 2020-07-04 MED ORDER — POLYETHYLENE GLYCOL 3350 17 G PO PACK: 17.0000 g | PACK | Freq: Every day | ORAL | 0 refills | Status: DC | PRN

## 2020-07-04 MED ORDER — METHOCARBAMOL 750 MG PO TABS: 750.0000 mg | ORAL_TABLET | Freq: Four times a day (QID) | ORAL | 0 refills | Status: DC | PRN

## 2020-07-04 MED ORDER — ACETAMINOPHEN 500 MG PO TABS: 1000.0000 mg | ORAL_TABLET | Freq: Three times a day (TID) | ORAL | 0 refills | Status: DC | PRN

## 2020-07-04 MED ORDER — ASPIRIN EC 325 MG PO TBEC: 325.0000 mg | DELAYED_RELEASE_TABLET | Freq: Two times a day (BID) | ORAL | 0 refills | Status: DC

## 2020-07-04 MED ORDER — OXYCODONE HCL 10 MG PO TABS: 5.0000 mg | ORAL_TABLET | Freq: Four times a day (QID) | ORAL | 0 refills | Status: DC | PRN

## 2020-07-04 NOTE — Plan of Care (Signed)
  Problem: Education: Goal: Knowledge of General Education information will improve Description: Including pain rating scale, medication(s)/side effects and non-pharmacologic comfort measures Outcome: Progressing   Problem: Health Behavior/Discharge Planning: Goal: Ability to manage health-related needs will improve Outcome: Progressing   Problem: Clinical Measurements: Goal: Ability to maintain clinical measurements within normal limits will improve Outcome: Progressing   Problem: Activity: Goal: Risk for activity intolerance will decrease Outcome: Progressing   Problem: Nutrition: Goal: Adequate nutrition will be maintained Outcome: Progressing   Problem: Coping: Goal: Level of anxiety will decrease Outcome: Progressing   Problem: Pain Managment: Goal: General experience of comfort will improve Outcome: Progressing   Problem: Safety: Goal: Ability to remain free from injury will improve Outcome: Progressing   Problem: Skin Integrity: Goal: Risk for impaired skin integrity will decrease Outcome: Progressing   Problem: Clinical Measurements: Goal: Postoperative complications will be avoided or minimized Outcome: Progressing

## 2020-07-04 NOTE — Progress Notes (Addendum)
Patient awake upon entering the room. Patient does not want anyone to bother him, patient is addiment about leaving today, states "If the PA or whoever isn't coming in here to talk to me and tell me I can go home, I don't want to talk to anybody."  Carlena Bjornstad, [place] aware.

## 2020-07-04 NOTE — Progress Notes (Signed)
[facility] Progress Note  2 Days Post-Op  Subjective: CC-  Irritated that he is still in the hospital, wants to go home. States that he will be fine and can manage on his own.  Objective: Vital signs in last 24 hours: Temp:  [98.3 F (36.8 C)-99.2 F (37.3 C)] 98.3 F (36.8 C) (12/19 0353) Pulse Rate:  [78-109] 78 (12/19 0353) Resp:  [17-20] 18 (12/19 0353) BP: (113-124)/(75-82) 113/82 (12/19 0353) SpO2:  [98 %-100 %] 100 % (12/19 0353) Last BM Date: 06/30/20  Intake/Output from previous day: 12/18 0701 - 12/19 0700 In: 872.3 [P.O.:480; I.V.:392.3] Out: 1100 [Urine:1100] Intake/Output this shift: No intake/output data recorded.  PE: Gen:  Alert, NAD HEENT: EOM's intact, pupils equal and round, abrasions to right cheek, right periorbital edema improving Card:  RRR, no M/G/R heard, 2+ DP pulses Pulm:  CTAB, no W/R/R, rate and effort normal on room air Abd: Soft, NT/ND, +BS Ext:  calves soft and nontender, cdi dressing to right lateral hip, compartments soft and compressible, splint to RUE Psych: A&Ox4  Neuro: no gross motor or sensory deficits BUE/BLE Skin: no rashes noted, warm and dry   Lab Results:  Recent Labs    07/02/20 0308 07/04/20 0749  WBC 10.4 8.1  HGB 13.0 10.4*  HCT 38.9* 30.6*  PLT 162 142*   BMET Recent Labs    07/01/20 1624 07/01/20 1641 07/02/20 0308  NA 133* 135 134*  K 3.8 3.5 4.0  CL 98 102 102  CO2 23  --  22  GLUCOSE 119* 120* 117*  BUN 7 7 5*  CREATININE 0.76 0.60* 0.81  CALCIUM 8.8*  --  8.6*   PT/INR Recent Labs    07/01/20 1624  LABPROT 13.0  INR 1.0   CMP     Component Value Date/Time   NA 134 (L) 07/02/2020 0308   K 4.0 07/02/2020 0308   CL 102 07/02/2020 0308   CO2 22 07/02/2020 0308   GLUCOSE 117 (H) 07/02/2020 0308   BUN 5 (L) 07/02/2020 0308   CREATININE 0.81 07/02/2020 0308   CALCIUM 8.6 (L) 07/02/2020 0308   PROT 5.7 (L) 07/02/2020 0308   ALBUMIN 3.7 07/02/2020 0308   AST 44 (H) 07/02/2020  0308   ALT 47 (H) 07/02/2020 0308   ALKPHOS 44 07/02/2020 0308   BILITOT 1.6 (H) 07/02/2020 0308   GFRNONAA >60 07/02/2020 0308   Lipase  No results found for: LIPASE     Studies/Results: DG C-Arm 1-60 Min  Result Date: 07/02/2020 CLINICAL DATA:  Right proximal femur fracture. EXAM: RIGHT FEMUR 2 VIEWS; DG C-ARM 1-60 MIN Radiation exposure index: 13.62 mGy. COMPARISON:  July 01, 2020. FINDINGS: Five intraoperative fluoroscopic images were obtained of the right femur. These images demonstrate intramedullary rod fixation of proximal right femoral fracture. Good alignment of fracture components is noted. IMPRESSION: Fluoroscopic guidance provided during surgical internal fixation of proximal right femoral fracture. Electronically Signed   By: Lupita Raider M.D.   On: 07/02/2020 12:25   DG FEMUR, MIN 2 VIEWS RIGHT  Result Date: 07/02/2020 CLINICAL DATA:  Right proximal femur fracture. EXAM: RIGHT FEMUR 2 VIEWS; DG C-ARM 1-60 MIN Radiation exposure index: 13.62 mGy. COMPARISON:  July 01, 2020. FINDINGS: Five intraoperative fluoroscopic images were obtained of the right femur. These images demonstrate intramedullary rod fixation of proximal right femoral fracture. Good alignment of fracture components is noted. IMPRESSION: Fluoroscopic guidance provided during surgical internal fixation of proximal right femoral fracture. Electronically Signed  By: Lupita Raider M.D.   On: 07/02/2020 12:25   DG FEMUR PORT, MIN 2 VIEWS RIGHT  Result Date: 07/02/2020 CLINICAL DATA:  Known right femoral fracture EXAM: RIGHT FEMUR PORTABLE 2 VIEW COMPARISON:  07/01/2020, 07/02/2020 FINDINGS: Medullary rod and proximal fixation screws are noted similar to that seen on prior intraoperative films. Distal fixation screws in the femur are noted as well. Fracture fragments are in near anatomic alignment. IMPRESSION: Status post ORIF of right femoral fracture. Electronically Signed   By: Alcide Clever M.D.   On:  07/02/2020 16:21    Anti-infectives: Anti-infectives (From admission, onward)   Start     Dose/Rate Route Frequency Ordered Stop   07/02/20 1515  ceFAZolin (ANCEF) IVPB 2g/100 mL premix        2 g 200 mL/hr over 30 Minutes Intravenous Every 8 hours 07/02/20 1421 07/03/20 0649   07/02/20 1057  vancomycin (VANCOCIN) powder  Status:  Discontinued          As needed 07/02/20 1057 07/02/20 1201   07/02/20 1001  ceFAZolin (ANCEF) 2-4 GM/100ML-% IVPB       Note to Pharmacy: Sabino Niemann   : cabinet override      07/02/20 1001 07/02/20 1757   07/02/20 0930  ceFAZolin (ANCEF) IVPB 2g/100 mL premix        2 g 200 mL/hr over 30 Minutes Intravenous On call to O.R. 07/02/20 0917 07/02/20 1039       Assessment/Plan Fall off of roof Right apical PTX- resolved on follow up CXR Right rib fx 3-8- Multimodal pain control, pulm toilet Right Comminuted femur/femoral neck fx- s/p IMN Dr. Jena Gauss 12/17. WBAT RLE. Ortho to change dressing today Right radial neck fx- nonop per Dr. Jena Gauss. Sling for comfort. WBAT RUE, may need to be weightbearing through a platform walker if he is unable to tolerate force through his forearm  Right Triquetral fx- nonop per Dr. Jena Gauss. Removable wrist splint.  WBAT RUE Right inferior orbit wall/max sinus fxw/ right upper central incisor broken- per Dr. Pollyann Kennedy, nonop, recommend avoid nose blowing x3 weeks Elevated transaminases- continue to trend but patient refused lab draw yesterday. No abdominal pain. No CT scan findings to suggest hepatobiliary injury.  Abrasions - Local wound care with bacitracin Tobacco use -Avoid nicotine replacement in setting of ortho injuries.  FEN -KVO IVF, reg diet VTE -SCDs, Loveonx ID -None Foley -none Dispo - Home health PT and DME ordered - TOC team is working on this. I think he needs to work with PT one more time, if this goes well he could be discharged later today. Ortho also needs to see as they plan to change his  dressing today. If he refuses to stay he can leave AMA. Lives at home with his GF and father in [place].     LOS: 3 days    Franne Forts, [facility] Surgery 07/04/2020, 9:55 AM Please see Amion for pager number during day hours 7:00am-4:30pm

## 2020-07-04 NOTE — TOC Transition Note (Signed)
Transition of Care [facility]) - CM/SW Discharge Note   Patient Details  Name: Cory Griffin. MRN: [id number] Date of Birth: 1985-11-04  [facility] CM/SW Contact:  Lawerance Sabal, RN Phone Number: 07/04/2020, 9:30 AM   Clinical Narrative:   DME ordered to be delivered to the room prior to DC, platform walker, tub bench, 3/1. Could not arrange [facility] services due to payor source, explained this to patient, he feels he can manage w/o HH therapy.     Final next level of care: Home/Self Care Barriers to Discharge: No Barriers Identified   Patient Goals and CMS Choice        Discharge Placement                       Discharge Plan and Services                DME Arranged: 3-N-1,Walker,Tub bench DME Agency: [employer] Date DME Agency Contacted: 07/04/20 Time DME Agency Contacted: 0930 Representative spoke with at DME Agency: Arnold Long            Social Determinants of Health (SDOH) Interventions     Readmission Risk Interventions No flowsheet data found.

## 2020-07-04 NOTE — Progress Notes (Signed)
AVS given and reviewed with pt. Medications discussed. Equipment delivered to bedside. All questions answered to satisfaction. Pt verbalized understanding of information given. Pt escorted off the unit with all belongings via wheelchair by staff member.

## 2020-07-04 NOTE — Plan of Care (Signed)

## 2020-07-04 NOTE — Progress Notes (Signed)
Subjective: 2 Days Post-Op s/p Procedure(s): INTRAMEDULLARY (IM) NAIL INTERTROCHANTRIC  Patient sitting on side of bed, eager to get home today. Pain worse with movement but overall well controlled.  Objective:  PE: VITALS:   Vitals:   07/03/20 0942 07/03/20 1321 07/03/20 2108 07/04/20 0353  BP: 115/70 115/75 124/82 113/82  Pulse: 74 87 (!) 109 78  Resp: 16 17 20 18   Temp: 98.6 F (37 C) 98.6 F (37 C) 99.2 F (37.3 C) 98.3 F (36.8 C)  TempSrc: Oral Oral Oral Oral  SpO2: 99% 98% 99% 100%  Weight:      Height:       General: sitting up on side of bed, in no acute distress. Resp: no increased work of breathing Abd: soft, non-tender MSK: RLE - dressings intact with no drainage, changed, incisions well approximated with no drainage noted. Patient able to stand up with help of walker for dressing change. Compartment soft and compressible. Dorsiflexion and plantarflexion intact. Able to move all toes. Distal sensation intact. Foot warm and well perfused.  RUE - splint in place. Full AROM at right elbow without pain. Able to flex, extend, and abduct all fingers of right hand. Sensation intact to all aspects of hand. + radial pulse. No significant edema to hand or wrist.  LABS  Results for orders placed or performed during the hospital encounter of 07/01/20 (from the past 24 hour(s))  CBC     Status: Abnormal   Collection Time: 07/04/20  7:49 AM  Result Value Ref Range   WBC 8.1 4.0 - 10.5 K/uL   RBC 3.09 (L) 4.22 - 5.81 MIL/uL   Hemoglobin 10.4 (L) 13.0 - 17.0 g/dL   HCT 07/06/20 (L) 73.5 - 32.9 %   MCV 99.0 80.0 - 100.0 fL   MCH 33.7 26.0 - 34.0 pg   MCHC 34.0 30.0 - 36.0 g/dL   RDW 92.4 (L) 26.8 - 34.1 %   Platelets 142 (L) 150 - 400 K/uL   nRBC 0.0 0.0 - 0.2 %    DG C-Arm 1-60 Min  Result Date: 07/02/2020 CLINICAL DATA:  Right proximal femur fracture. EXAM: RIGHT FEMUR 2 VIEWS; DG C-ARM 1-60 MIN Radiation exposure index: 13.62 mGy. COMPARISON:  July 01, 2020.  FINDINGS: Five intraoperative fluoroscopic images were obtained of the right femur. These images demonstrate intramedullary rod fixation of proximal right femoral fracture. Good alignment of fracture components is noted. IMPRESSION: Fluoroscopic guidance provided during surgical internal fixation of proximal right femoral fracture. Electronically Signed   By: July 03, 2020 M.D.   On: 07/02/2020 12:25   DG FEMUR, MIN 2 VIEWS RIGHT  Result Date: 07/02/2020 CLINICAL DATA:  Right proximal femur fracture. EXAM: RIGHT FEMUR 2 VIEWS; DG C-ARM 1-60 MIN Radiation exposure index: 13.62 mGy. COMPARISON:  July 01, 2020. FINDINGS: Five intraoperative fluoroscopic images were obtained of the right femur. These images demonstrate intramedullary rod fixation of proximal right femoral fracture. Good alignment of fracture components is noted. IMPRESSION: Fluoroscopic guidance provided during surgical internal fixation of proximal right femoral fracture. Electronically Signed   By: July 03, 2020 M.D.   On: 07/02/2020 12:25   DG FEMUR PORT, MIN 2 VIEWS RIGHT  Result Date: 07/02/2020 CLINICAL DATA:  Known right femoral fracture EXAM: RIGHT FEMUR PORTABLE 2 VIEW COMPARISON:  07/01/2020, 07/02/2020 FINDINGS: Medullary rod and proximal fixation screws are noted similar to that seen on prior intraoperative films. Distal fixation screws in the femur are noted as well. Fracture fragments are  in near anatomic alignment. IMPRESSION: Status post ORIF of right femoral fracture. Electronically Signed   By: Alcide Clever M.D.   On: 07/02/2020 16:21    Assessment/Plan: Right proximal femur fracture, right radial neck fracture, right triquetral avulsion fracture after fall - Post op Day 2 - cephalomedullary nailing of right femur fracture, closed treatment of right triquetrum and right radius fractures - Weightbearing: WBAT RLE and RUE, sling for comfort, ROM as tolerated with RUE - may need to be weightbearing through a  platform walker if he is unable to tolerate force through his forearm Insicional and dressing care: dressings changed, no drainage seen Orthopedic device(s): removable wrist splint and sling for the RUE as needed VTE prophylaxis: lovenox, SCD's Pain control: tylenol, oxycodone, morphine, robaxin was increased per truama this am Dispo: TOC consulted regarding HHPT and DME, DME ordered, patient does not qualify for HHPT and refuses CIR on my conversation with him. Will talk to Dr. Luvenia Starch office about possibility for outpatient PT, patient states he is amenable depending on cost. Patient ok to discharge from ortho standpoint after PT today, would benefit from HEP from PT as well.  Armida Sans 07/04/2020, 11:10 AM

## 2020-07-04 NOTE — Discharge Instructions (Signed)
Weightbearing: Weight bearing as tolerated right lower extremity and right upper extremity, sling for comfort, range of motion as tolerated with right upper extremity - may need to be weightbearing through a platform walker if you are unable to tolerate force through your forearm Orthopedic device(s): removable wrist splint and sling for the right upper extremity as needed  RIB FRACTURES  HOME INSTRUCTIONS   1. PAIN CONTROL:  1. Pain is best controlled by a usual combination of three different methods TOGETHER:  i. Ice/Heat ii. Over the counter pain medication iii. Prescription pain medication 2. You may experience some swelling and bruising in area of broken ribs. Ice packs or heating pads (30-60 minutes up to 6 times a day) will help. Use ice for the first few days to help decrease swelling and bruising, then switch to heat to help relax tight/sore spots and speed recovery. Some people prefer to use ice alone, heat alone, alternating between ice & heat. Experiment to what works for you. Swelling and bruising can take several weeks to resolve.  3. It is helpful to take an over-the-counter pain medication regularly for the first few weeks. Choose one of the following that works best for you:  i. Naproxen (Aleve, etc) Two 220mg  tabs twice a day ii. Ibuprofen (Advil, etc) Three 200mg  tabs four times a day (every meal & bedtime) iii. Acetaminophen (Tylenol, etc) 500-650mg  four times a day (every meal & bedtime) 4. A prescription for pain medication (such as oxycodone, hydrocodone, etc) may be given to you upon discharge. Take your pain medication as prescribed.  i. If you are having problems/concerns with the prescription medicine (does not control pain, nausea, vomiting, rash, itching, etc), please call [phone] to see if we need to switch you to a different pain medicine that will work better for you and/or control your side effect better. ii. If you need a refill on your pain medication,  please contact your pharmacy. They will contact our office to request authorization. Prescriptions will not be filled after 5 pm or on week-ends. 1. Avoid getting constipated. When taking pain medications, it is common to experience some constipation. Increasing fluid intake and taking a fiber supplement (such as Metamucil, Citrucel, FiberCon, MiraLax, etc) 1-2 times a day regularly will usually help prevent this problem from occurring. A mild laxative (prune juice, Milk of Magnesia, MiraLax, etc) should be taken according to package directions if there are no bowel movements after 48 hours.  2. Watch out for diarrhea. If you have many loose bowel movements, simplify your diet to bland foods & liquids for a few days. Stop any stool softeners and decrease your fiber supplement. Switching to mild anti-diarrheal medications (Kayopectate, Pepto Bismol) can help. If this worsens or does not improve, please call us. 3. FOLLOW UP  a. If a follow up appointment is needed one will be scheduled for you. If none is needed with our trauma team, please follow up with your primary care provider within 2-3 weeks from discharge. Please call [facility] at [phone] if you have any questions about follow up.  b. If you have any orthopedic or other injuries you will need to follow up as outlined in your follow up instructions.   WHEN TO CALL us [phone]:  1. Poor pain control 2. Reactions / problems with new medications (rash/itching, nausea, etc)  3. Fever over 101.5 F (38.5 C) 4. Worsening swelling or bruising 5. Worsening pain, productive cough, difficulty breathing or any other concerning symptoms  The clinic staff is available to answer your questions during regular business hours (8:30am-5pm). Please don't hesitate to call and ask to speak to one of our nurses for clinical concerns.  If you have a medical emergency, go to the nearest emergency room or call 911.  A surgeon from [facility] is  always on call at the [facility], [place]  [address], [address], [place], [place] [address] ?  MAIN: [phone] ? TOLL FREE: [phone] ?  FAX [phone]  [email or web address]      Information on Rib Fractures  A rib fracture is a break or crack in one of the bones of the ribs. The ribs are long, curved bones that wrap around your chest and attach to your spine and your breastbone. The ribs protect your heart, lungs, and other organs in the chest. A broken or cracked rib is often painful but is not usually serious. Most rib fractures heal on their own over time. However, rib fractures can be more serious if multiple ribs are broken or if broken ribs move out of place and push against other structures or organs. What are the causes? This condition is caused by:  Repetitive movements with high force, such as pitching a baseball or having severe coughing spells.  A direct blow to the chest, such as a sports injury, a car accident, or a fall.  Cancer that has spread to the bones, which can weaken bones and cause them to break. What are the signs or symptoms? Symptoms of this condition include:  Pain when you breathe in or cough.  Pain when someone presses on the injured area.  Feeling short of breath. How is this diagnosed? This condition is diagnosed with a physical exam and medical history. Imaging tests may also be done, such as:  Chest X-ray.  CT scan.  MRI.  Bone scan.  Chest ultrasound. How is this treated? Treatment for this condition depends on the severity of the fracture. Most rib fractures usually heal on their own in 1-3 months. Sometimes healing takes longer if there is a cough that does not stop or if there are other activities that make the injury worse (aggravating factors). While you heal, you will be given medicines to control the pain. You will also be taught deep breathing exercises. Severe injuries may  require hospitalization or surgery. Follow these instructions at home: Managing pain, stiffness, and swelling  If directed, apply ice to the injured area. ? Put ice in a plastic bag. ? Place a towel between your skin and the bag. ? Leave the ice on for 20 minutes, 2-3 times a day.  Take over-the-counter and prescription medicines only as told by your health care provider. Activity  Avoid a lot of activity and any activities or movements that cause pain. Be careful during activities and avoid bumping the injured rib.  Slowly increase your activity as told by your health care provider. General instructions  Do deep breathing exercises as told by your health care provider. This helps prevent pneumonia, which is a common complication of a broken rib. Your health care provider may instruct you to: ? Take deep breaths several times a day. ? Try to cough several times a day, holding a pillow against the injured area. ? Use a device called incentive spirometer to practice deep breathing several times a day.  Drink enough fluid to keep your urine pale yellow.  Do not wear a rib belt or  binder. These restrict breathing, which can lead to pneumonia.  Keep all follow-up visits as told by your health care provider. This is important. Contact a health care provider if:  You have a fever. Get help right away if:  You have difficulty breathing or you are short of breath.  You develop a cough that does not stop, or you cough up thick or bloody sputum.  You have nausea, vomiting, or pain in your abdomen.  Your pain gets worse and medicine does not help. Summary  A rib fracture is a break or crack in one of the bones of the ribs.  A broken or cracked rib is often painful but is not usually serious.  Most rib fractures heal on their own over time.  Treatment for this condition depends on the severity of the fracture.  Avoid a lot of activity and any activities or movements that cause  pain. This information is not intended to replace advice given to you by your health care provider. Make sure you discuss any questions you have with your health care provider. Document Released: 07/03/2005 Document Revised: 10/02/2016 Document Reviewed: 10/02/2016 Elsevier Interactive Patient Education  2019 [employer]   Smoking Tobacco Information, Adult Smoking tobacco can be harmful to your health. Tobacco contains a poisonous (toxic), colorless chemical called nicotine. Nicotine is addictive. It changes the brain and can make it hard to stop smoking. Tobacco also has other toxic chemicals that can hurt your body and raise your risk of many cancers. How can smoking tobacco affect me? Smoking tobacco puts you at risk for:  Cancer. Smoking is most commonly associated with lung cancer, but can also lead to cancer in other parts of the body.  Chronic obstructive pulmonary disease (COPD). This is a long-term lung condition that makes it hard to breathe. It also gets worse over time.  High blood pressure (hypertension), heart disease, stroke, or heart attack.  Lung infections, such as pneumonia.  Cataracts. This is when the lenses in the eyes become clouded.  Digestive problems. This may include peptic ulcers, heartburn, and gastroesophageal reflux disease (GERD).  Oral health problems, such as gum disease and tooth loss.  Loss of taste and smell. Smoking can affect your appearance by causing:  Wrinkles.  Yellow or stained teeth, fingers, and fingernails. Smoking tobacco can also affect your social life, because:  It may be challenging to find places to smoke when away from home. Many workplaces, [employer], hotels, and public places are tobacco-free.  Smoking is expensive. This is due to the cost of tobacco and the long-term costs of treating health problems from smoking.  Secondhand smoke may affect those around you. Secondhand smoke can cause lung cancer, breathing  problems, and heart disease. Children of smokers have a higher risk for: ? Sudden infant death syndrome (SIDS). ? Ear infections. ? Lung infections. If you currently smoke tobacco, quitting now can help you:  Lead a longer and healthier life.  Look, smell, breathe, and feel better over time.  Save money.  Protect others from the harms of secondhand smoke. What actions can I take to prevent health problems? Quit smoking   Do not start smoking. Quit if you already do.  Make a plan to quit smoking and commit to it. Look for programs to help you and ask your health care provider for recommendations and ideas.  Set a date and write down all the reasons you want to quit.  Let your friends and family know you are quitting  so they can help and support you. Consider finding friends who also want to quit. It can be easier to quit with someone else, so that you can support each other.  Talk with your health care provider about using nicotine replacement medicines to help you quit, such as gum, lozenges, patches, sprays, or pills.  Do not replace cigarette smoking with electronic cigarettes, which are commonly called e-cigarettes. The safety of e-cigarettes is not known, and some may contain harmful chemicals.  If you try to quit but return to smoking, stay positive. It is common to slip up when you first quit, so take it one day at a time.  Be prepared for cravings. When you feel the urge to smoke, chew gum or suck on hard candy. Lifestyle  Stay busy and take care of your body.  Drink enough fluid to keep your urine pale yellow.  Get plenty of exercise and eat a healthy diet. This can help prevent weight gain after quitting.  Monitor your eating habits. Quitting smoking can cause you to have a larger appetite than when you smoke.  Find ways to relax. Go out with friends or family to a movie or a restaurant where people do not smoke.  Ask your health care provider about having regular  tests (screenings) to check for cancer. This may include blood tests, imaging tests, and other tests.  Find ways to manage your stress, such as meditation, yoga, or exercise. Where to find support To get support to quit smoking, consider:  Asking your health care provider for more information and resources.  Taking classes to learn more about quitting smoking.  Looking for local organizations that offer resources about quitting smoking.  Joining a support group for people who want to quit smoking in your local community.  Calling the smokefree.gov counselor helpline: 1-800-Quit-Now 410-711-9030) Where to find more information You may find more information about quitting smoking from:  HelpGuide.org: www.helpguide.org  BankRights.uy: smokefree.gov  American Lung Association: www.lung.org Contact a health care provider if you:  Have problems breathing.  Notice that your lips, nose, or fingers turn blue.  Have chest pain.  Are coughing up blood.  Feel faint or you pass out.  Have other health changes that cause you to worry. Summary  Smoking tobacco can negatively affect your health, the health of those around you, your finances, and your social life.  Do not start smoking. Quit if you already do. If you need help quitting, ask your health care provider.  Think about joining a support group for people who want to quit smoking in your local community. There are many effective programs that will help you to quit this behavior. This information is not intended to replace advice given to you by your health care provider. Make sure you discuss any questions you have with your health care provider. Document Revised: 03/28/2019 Document Reviewed: 07/18/2016 Elsevier Patient Education  2020 [employer].

## 2020-07-04 NOTE — Discharge Summary (Signed)
[facility] Discharge Summary   Patient ID: Cory Griffin. MRN: [id number] DOB/AGE: 01-22-1986 34 y.o.  Admit date: 07/01/2020 Discharge date: 07/04/2020  Admitting Diagnosis: fall off of roof Right Comminuted femur/femoral neck fx Right rib fx 3-8 Right radial neck fx Right  Triquetral fx Right inferior orbit wall/max sinus fx Right upper central incisor broken Elevated transaminases  Discharge Diagnosis Fall off of roof Right apical PTX Right rib fx 3-8 Right Comminuted femur/femoral neck fx Right radial neck fx Right Triquetral fx Right inferior orbit wall/max sinus fxw/ right upper central incisor broken Elevated transaminases Abrasions Tobacco use   Consultants Orthopedics ENT  Imaging: DG FEMUR PORT, MIN 2 VIEWS RIGHT  Result Date: 07/02/2020 CLINICAL DATA:  Known right femoral fracture EXAM: RIGHT FEMUR PORTABLE 2 VIEW COMPARISON:  07/01/2020, 07/02/2020 FINDINGS: Medullary rod and proximal fixation screws are noted similar to that seen on prior intraoperative films. Distal fixation screws in the femur are noted as well. Fracture fragments are in near anatomic alignment. IMPRESSION: Status post ORIF of right femoral fracture. Electronically Signed   By: Alcide Clever M.D.   On: 07/02/2020 16:21    Procedures Dr. Jena Gauss (07/02/2020) -  1. CPT 27506-Cephalomedullary nailing of right femur fracture 2. CPT 25630-Closed treatment of right triquetrum fracture 3. CPT 24650-Closed treatment of right proximal radius fracture  Hospital Course:  Cory Ollis. is a 34yo male who presented to [facility] 07/01/20 after falling off of a roof as level 2 trauma alert. Reportedly fell 19ft, landing on right side, gcs 15, reporedly had 1 beer earlier today. Evaluated and worked up by ED and called trauma to admit given multiple systems. Injuries listed below:  Right apical PTX  Resolved on follow up chest xray the following morning.   Right rib fracture  3-8 Managed with multimodal pain control and pulmonary toilet   Right Comminuted femur/femoral neck fracture  Orthopedics was consulted and took the patient to the operating room 12/17 for Cephalomedullary nailing. He was advised WBAT RLE postoperatively.  Right radial neck fracture Orthopedics was consulted and recommended nonoperative management. He was given a sling for comfort. Advised WBAT RUE, but may need to be weightbearing through a platform walker if he is unable to tolerate force through his forearm.  Right Triquetral fracture Orthopedics was consulted and recommended nonoperative management. He was given a removable wrist splint. Weightbearing status as above.  Right inferior orbit wall/max sinus fracturewith right upper central incisor broken ENT was consulted and recommended nonoperative management. He was advised to avoid nose blowing x3 weeks.  Elevated transaminases Noted on admission. No CT scan findings to suggest hepatobiliary injury and abdominal exam benign. CMP rechecked once and transaminases trended down, patient refused labs the following day.  Abrasions  Local wound care with bacitracin  Patient worked with therapies during this admission who recommended home with home health therapies when medically stable for discharge. Unfortunately the patient did not have insurance and was not a candidate for home health. He may be able to be referred to outpatient therapy during orthopedic follow up. On 12/19, the patient was voiding well, tolerating diet, mobilizing well, pain well controlled, vital signs stable, incisions c/d/i and felt stable for discharge home.  Patient will follow up as below and knows to call with questions or concerns.    I have personally reviewed the patients medication history on the [place] controlled substance database.     Allergies as of 07/04/2020   No Known Allergies  Medication List    TAKE these medications   acetaminophen 500 MG  tablet Commonly known as: TYLENOL Take 2 tablets (1,000 mg total) by mouth every 8 (eight) hours as needed.   aspirin EC 325 MG tablet Take 1 tablet (325 mg total) by mouth in the morning and at bedtime.   bacitracin ointment Apply topically 2 (two) times daily.   methocarbamol 750 MG tablet Commonly known as: ROBAXIN Take 1 tablet (750 mg total) by mouth every 6 (six) hours as needed for muscle spasms.   Oxycodone HCl 10 MG Tabs Take 0.5-1 tablets (5-10 mg total) by mouth every 6 (six) hours as needed for moderate pain or severe pain (5mg  for moderate pain, 10mg  for severe pain).   polyethylene glycol 17 g packet Commonly known as: MIRALAX / GLYCOLAX Take 17 g by mouth daily as needed for mild constipation.            Durable Medical Equipment  (From admission, onward)         Start     Ordered   07/04/20 0836  For home use only DME Tub bench  Once        07/04/20 0837   07/04/20 0835  For home use only DME Walker rolling  Once       Comments: Rolling walker with 5" wheels and right platform  Question Answer Comment  Walker: With 5 Inch Wheels   Patient needs a walker to treat with the following condition Closed displaced fracture of right femoral neck (HCC)      07/04/20 0837   07/04/20 0833  For home use only DME 3 n 1  Once        07/04/20 07/06/20            Follow-up Information    Haddix, 07/06/20, MD. Call.   Specialty: Orthopedic Surgery Why: regarding recent orthopedic surgery Contact information: [address] [place] [address] [place] [phone]        [id number], MD. Call.   Specialty: Otolaryngology Why: regarding facial fractures Contact information: [address] [place] [address] [employer] [place] [phone]        [facility]. Call.   Why: to establish a primary care physician. you need a primary doctor to follow up regarding rib fractures Contact information: [address] [place] [phone]              Signed: [id number], [facility] 07/04/2020, 12:11 PM Please see Amion for pager number during day hours 7:00am-4:30pm

## 2020-07-05 ENCOUNTER — Encounter (HOSPITAL_COMMUNITY): Payer: Self-pay | Admitting: Student

## 2021-05-09 ENCOUNTER — Other Ambulatory Visit: Payer: Self-pay

## 2021-05-09 ENCOUNTER — Emergency Department
Admission: EM | Admit: 2021-05-09 | Discharge: 2021-05-09 | Disposition: A | Discharge status: Home / Self Care | Payer: No Typology Code available for payment source | Attending: Emergency Medicine | Admitting: Emergency Medicine

## 2021-05-09 DIAGNOSIS — F17210 Nicotine dependence, cigarettes, uncomplicated: Secondary | ICD-10-CM | POA: Diagnosis not present

## 2021-05-09 DIAGNOSIS — Z79899 Other long term (current) drug therapy: Secondary | ICD-10-CM | POA: Diagnosis not present

## 2021-05-09 DIAGNOSIS — N3001 Acute cystitis with hematuria: Secondary | ICD-10-CM | POA: Diagnosis not present

## 2021-05-09 DIAGNOSIS — R300 Dysuria: Secondary | ICD-10-CM | POA: Diagnosis present

## 2021-05-09 DIAGNOSIS — Z7982 Long term (current) use of aspirin: Secondary | ICD-10-CM | POA: Diagnosis not present

## 2021-05-09 LAB — URINALYSIS, COMPLETE (UACMP) WITH MICROSCOPIC
Bacteria, UA: NONE SEEN
Bilirubin Urine: NEGATIVE
Glucose, UA: NEGATIVE mg/dL
Ketones, ur: 5 mg/dL — AB
Nitrite: NEGATIVE
Protein, ur: 30 mg/dL — AB
Specific Gravity, Urine: 1.025 (ref 1.005–1.030)
Squamous Epithelial / HPF: NONE SEEN (ref 0–5)
WBC, UA: >50 WBC/hpf — ABNORMAL HIGH (ref 0–5)
pH: 6 (ref 5.0–8.0)

## 2021-05-09 LAB — CHLAMYDIA/NGC RT PCR (ARMC ONLY)
Chlamydia Tr: NOT DETECTED
N gonorrhoeae: DETECTED — AB

## 2021-05-09 MED ORDER — CEFPODOXIME PROXETIL 200 MG PO TABS: 200.0000 mg | ORAL_TABLET | Freq: Two times a day (BID) | ORAL | 0 refills | Status: DC

## 2021-05-09 MED ORDER — CEFPODOXIME PROXETIL 200 MG PO TABS: 200.0000 mg | ORAL_TABLET | Freq: Two times a day (BID) | ORAL | 0 refills | Status: AC

## 2021-05-09 NOTE — ED Triage Notes (Signed)
Pt comes with c/o pain and burning wit urination for three days.

## 2021-05-09 NOTE — Discharge Instructions (Signed)
Take Vantin twice daily for the next 10 days.

## 2021-05-09 NOTE — ED Provider Notes (Signed)
[facility]-EMERGENCY DEPARTMENT  ____________________________________________  Time seen: Approximately 3:31 PM  I have reviewed the triage vital signs and the nursing notes.   HISTORY  Chief Complaint UTI   Historian Patient     HPI Cory Ackers. is a 35 y.o. male presents to the emergency department with dysuria and increased urinary frequency for the past 3 days with some low back pain and suprapubic pain.  Patient reports that he has had urinary tract infections in the past.  No penile discharge and he has absolutely no concerns for STDs as he states that he is in a monogamous relationship with his wife.  No hematuria to his knowledge. He's been afebrile at home.    History reviewed. No pertinent past medical history.   Immunizations up to date:  Yes.     History reviewed. No pertinent past medical history.  Patient Active Problem List   Diagnosis Date Noted   Fall 07/01/2020    Past Surgical History:  Procedure Laterality Date   INTRAMEDULLARY (IM) NAIL INTERTROCHANTERIC Right 07/02/2020   Procedure: INTRAMEDULLARY (IM) NAIL INTERTROCHANTRIC;  Surgeon: Roby Lofts, MD;  Location: [facility] OR;  Service: Orthopedics;  Laterality: Right;    Prior to Admission medications   Medication Sig Start Date End Date Taking? Authorizing Provider  acetaminophen (TYLENOL) 500 MG tablet Take 2 tablets (1,000 mg total) by mouth every 8 (eight) hours as needed. 07/04/20   Meuth, Lina Sar, PA-C  aspirin EC 325 MG tablet Take 1 tablet (325 mg total) by mouth in the morning and at bedtime. 07/04/20   Meuth, Brooke A, PA-C  bacitracin ointment Apply topically 2 (two) times daily. 07/04/20   Meuth, Brooke A, PA-C  cefpodoxime (VANTIN) 200 MG tablet Take 1 tablet (200 mg total) by mouth 2 (two) times daily for 10 days. 05/09/21 05/19/21  Orvil Feil, PA-C  methocarbamol (ROBAXIN) 750 MG tablet Take 1 tablet (750 mg total) by mouth every 6 (six) hours as needed for muscle spasms.  07/04/20   Meuth, Brooke A, PA-C  polyethylene glycol (MIRALAX / GLYCOLAX) 17 g packet Take 17 g by mouth daily as needed for mild constipation. 07/04/20   Meuth, Lina Sar, PA-C    Allergies Patient has no known allergies.  No family history on file.  Social History Social History   Tobacco Use   Smoking status: Every Day   Smokeless tobacco: Never  Substance Use Topics   Alcohol use: Yes   Drug use: No     Review of Systems  Constitutional: No fever/chills Eyes:  No discharge ENT: No upper respiratory complaints. Respiratory: no cough. No SOB/ use of accessory muscles to breath Gastrointestinal:   No nausea, no vomiting.  No diarrhea.  No constipation. Genitourinary: Patient has dysuria.  Musculoskeletal: Negative for musculoskeletal pain. Skin: Negative for rash, abrasions, lacerations, ecchymosis.    ____________________________________________   PHYSICAL EXAM:  VITAL SIGNS: ED Triage Vitals  Enc Vitals Group     BP 05/09/21 1433 (!) 146/90     Pulse Rate 05/09/21 1433 61     Resp 05/09/21 1433 15     Temp 05/09/21 1433 98.3 F (36.8 C)     Temp Source 05/09/21 1433 Oral     SpO2 05/09/21 1433 99 %     Weight --      Height --      Head Circumference --      Peak Flow --      Pain Score 05/09/21 1452 10  Pain Loc --      Pain Edu? --      Excl. in GC? --      Constitutional: Alert and oriented. Well appearing and in no acute distress. Eyes: Conjunctivae are normal. PERRL. EOMI. Head: Atraumatic. ENT:      Nose: No congestion/rhinnorhea.      Mouth/Throat: Mucous membranes are moist.  Neck: No stridor.  No cervical spine tenderness to palpation. Cardiovascular: Normal rate, regular rhythm. Normal S1 and S2.  Good peripheral circulation. Respiratory: Normal respiratory effort without tachypnea or retractions. Lungs CTAB. Good air entry to the bases with no decreased or absent breath sounds Gastrointestinal: Bowel sounds x 4 quadrants. Soft and  nontender to palpation. No guarding or rigidity. No distention. Musculoskeletal: Full range of motion to all extremities. No obvious deformities noted Neurologic:  Normal for age. No gross focal neurologic deficits are appreciated.  Skin:  Skin is warm, dry and intact. No rash noted. Psychiatric: Mood and affect are normal for age. Speech and behavior are normal.   ____________________________________________   LABS (all labs ordered are listed, but only abnormal results are displayed)  Labs Reviewed  URINALYSIS, COMPLETE (UACMP) WITH MICROSCOPIC - Abnormal; Notable for the following components:      Result Value   Color, Urine YELLOW (*)    APPearance CLOUDY (*)    Hgb urine dipstick SMALL (*)    Ketones, ur 5 (*)    Protein, ur 30 (*)    Leukocytes,Ua LARGE (*)    WBC, UA >50 (*)    All other components within normal limits  CHLAMYDIA/NGC RT PCR ([facility] ONLY)            URINE CULTURE   ____________________________________________  EKG   ____________________________________________  RADIOLOGY   No results found.  ____________________________________________    PROCEDURES  Procedure(s) performed:     Procedures     Medications - No data to display   ____________________________________________   INITIAL IMPRESSION / ASSESSMENT AND PLAN / ED COURSE  Pertinent labs & imaging results that were available during my care of the patient were reviewed by me and considered in my medical decision making (see chart for details).      Assessment and Plan:  Dysuria:  35 year old male presents to the emergency department complaining of dysuria, low back pain and suprapubic pain.  Urinalysis indicates a large amount of white blood cells and a small amount of blood concerning for UTI.  Urine culture is in process at this time.  Patient states that he is in a monogamous relationship with his wife and has no concerns for STDs and has history of UTIs in the past.  We  will treat with cefpodoxime twice daily for the next 10 days and have patient follow-up with urology as an outpatient.  Return precautions were given to return with new or worsening symptoms.  All patient questions were answered.    ____________________________________________  FINAL CLINICAL IMPRESSION(S) / ED DIAGNOSES  Final diagnoses:  Acute cystitis with hematuria      NEW MEDICATIONS STARTED DURING THIS VISIT:  ED Discharge Orders          Ordered    cefpodoxime (VANTIN) 200 MG tablet  2 times daily,   Status:  Discontinued        05/09/21 1558    cefpodoxime (VANTIN) 200 MG tablet  2 times daily        05/09/21 1604  This chart was dictated using voice recognition software/Dragon. Despite best efforts to proofread, errors can occur which can change the meaning. Any change was purely unintentional.     Orvil Feil, PA-C 05/09/21 1609    Chesley Noon, MD 05/09/21 2127

## 2021-05-10 ENCOUNTER — Encounter: Payer: Self-pay | Admitting: Intensive Care

## 2021-05-10 ENCOUNTER — Other Ambulatory Visit: Payer: Self-pay

## 2021-05-10 ENCOUNTER — Emergency Department
Admission: EM | Admit: 2021-05-10 | Discharge: 2021-05-10 | Disposition: A | Discharge status: Home / Self Care | Payer: No Typology Code available for payment source | Attending: Emergency Medicine | Admitting: Emergency Medicine

## 2021-05-10 ENCOUNTER — Telehealth: Payer: Self-pay | Admitting: Emergency Medicine

## 2021-05-10 DIAGNOSIS — Z7982 Long term (current) use of aspirin: Secondary | ICD-10-CM | POA: Insufficient documentation

## 2021-05-10 DIAGNOSIS — F17210 Nicotine dependence, cigarettes, uncomplicated: Secondary | ICD-10-CM | POA: Diagnosis not present

## 2021-05-10 DIAGNOSIS — A549 Gonococcal infection, unspecified: Secondary | ICD-10-CM | POA: Insufficient documentation

## 2021-05-10 DIAGNOSIS — Z202 Contact with and (suspected) exposure to infections with a predominantly sexual mode of transmission: Secondary | ICD-10-CM | POA: Diagnosis present

## 2021-05-10 LAB — URINE CULTURE: Culture: NO GROWTH

## 2021-05-10 MED ORDER — LIDOCAINE HCL (PF) 1 % IJ SOLN
5.0000 mL | Freq: Once | INTRAMUSCULAR | Status: AC
  Administered 2021-05-10: 5 mL
  Filled 2021-05-10: qty 5

## 2021-05-10 MED ORDER — DOXYCYCLINE HYCLATE 100 MG PO TABS: 100.0000 mg | ORAL_TABLET | Freq: Two times a day (BID) | ORAL | 0 refills | Status: DC

## 2021-05-10 MED ORDER — CEFTRIAXONE SODIUM 1 G IJ SOLR
500.0000 mg | Freq: Once | INTRAMUSCULAR | Status: AC
  Administered 2021-05-10: 500 mg via INTRAMUSCULAR
  Filled 2021-05-10: qty 10

## 2021-05-10 NOTE — Discharge Instructions (Addendum)
You need to take a probiotic while you take your antibiotics to prevent your stomach from being upset and diarrhea.  Probiotics can be found over-the-counter.  If you have any difficulty asked the pharmacist and they can point out for you.

## 2021-05-10 NOTE — ED Notes (Signed)
Pt came to the front desk asking where he was in line to be seen, pt informed he has to stay in the lobby if he wants to treated.

## 2021-05-10 NOTE — ED Triage Notes (Signed)
Patient reports he was told to come to ER for treatment for gonorrhea

## 2021-05-10 NOTE — ED Provider Notes (Signed)
[facility] Emergency Department Provider Note  ____________________________________________  Time seen: Approximately 4:32 PM  I have reviewed the triage vital signs and the nursing notes.   HISTORY  Chief Complaint SEXUALLY TRANSMITTED DISEASE    HPI Cory L Harvir Patry. is a 35 y.o. male who presents the emergency department for complaint of dysuria.  Patient was seen yesterday, had no concerns at that time for STD and findings that were concerning for infection.  Patient had gonorrhea and chlamydia but this did not return.  He was not treated empirically for gonorrhea chlamydia, had been placed on antibiotics for UTI.  Results returned positive for gonorrhea and patient was instructed to return for antibiotics.  Patient with no hematuria, flank pain or abdominal pain at this time       History reviewed. No pertinent past medical history.  Patient Active Problem List   Diagnosis Date Noted   Fall 07/01/2020    Past Surgical History:  Procedure Laterality Date   INTRAMEDULLARY (IM) NAIL INTERTROCHANTERIC Right 07/02/2020   Procedure: INTRAMEDULLARY (IM) NAIL INTERTROCHANTRIC;  Surgeon: Roby Lofts, MD;  Location: [facility] OR;  Service: Orthopedics;  Laterality: Right;    Prior to Admission medications   Medication Sig Start Date End Date Taking? Authorizing Provider  doxycycline (VIBRA-TABS) 100 MG tablet Take 1 tablet (100 mg total) by mouth 2 (two) times daily. 05/10/21  Yes Zoiee Wimmer, Delorise Royals, PA-C  acetaminophen (TYLENOL) 500 MG tablet Take 2 tablets (1,000 mg total) by mouth every 8 (eight) hours as needed. 07/04/20   Meuth, Lina Sar, PA-C  aspirin EC 325 MG tablet Take 1 tablet (325 mg total) by mouth in the morning and at bedtime. 07/04/20   Meuth, Brooke A, PA-C  bacitracin ointment Apply topically 2 (two) times daily. 07/04/20   Meuth, Brooke A, PA-C  cefpodoxime (VANTIN) 200 MG tablet Take 1 tablet (200 mg total) by mouth 2 (two) times daily for  10 days. 05/09/21 05/19/21  Orvil Feil, PA-C  methocarbamol (ROBAXIN) 750 MG tablet Take 1 tablet (750 mg total) by mouth every 6 (six) hours as needed for muscle spasms. 07/04/20   Meuth, Brooke A, PA-C  polyethylene glycol (MIRALAX / GLYCOLAX) 17 g packet Take 17 g by mouth daily as needed for mild constipation. 07/04/20   Meuth, Lina Sar, PA-C    Allergies Patient has no known allergies.  History reviewed. No pertinent family history.  Social History Social History   Tobacco Use   Smoking status: Every Day    Types: Cigarettes   Smokeless tobacco: Never  Vaping Use   Vaping Use: Some days   Substances: Nicotine  Substance Use Topics   Alcohol use: Yes    Alcohol/week: 21.0 standard drinks    Types: 21 Cans of beer per week   Drug use: No     Review of Systems  Constitutional: No fever/chills Eyes: No visual changes. No discharge ENT: No upper respiratory complaints. Cardiovascular: no chest pain. Respiratory: no cough. No SOB. Gastrointestinal: No abdominal pain.  No nausea, no vomiting.  No diarrhea.  No constipation. Genitourinary: Positive for gonorrhea, dysuria Musculoskeletal: Negative for musculoskeletal pain. Skin: Negative for rash, abrasions, lacerations, ecchymosis. Neurological: Negative for headaches, focal weakness or numbness.  10 System ROS otherwise negative.  ____________________________________________   PHYSICAL EXAM:  VITAL SIGNS: ED Triage Vitals [05/10/21 1609]  Enc Vitals Group     BP (!) 144/96     Pulse Rate 93     Resp 16  Temp 98.5 F (36.9 C)     Temp Source Oral     SpO2 98 %     Weight 149 lb 14.6 oz (68 kg)     Height 6\' 1"  (1.854 m)     Head Circumference      Peak Flow      Pain Score 0     Pain Loc      Pain Edu?      Excl. in GC?      Constitutional: Alert and oriented. Well appearing and in no acute distress. Eyes: Conjunctivae are normal. PERRL. EOMI. Head: Atraumatic. ENT:      Ears:       Nose:  No congestion/rhinnorhea.      Mouth/Throat: Mucous membranes are moist.  Neck: No stridor.    Cardiovascular: Normal rate, regular rhythm. Normal S1 and S2.  Good peripheral circulation. Respiratory: Normal respiratory effort without tachypnea or retractions. Lungs CTAB. Good air entry to the bases with no decreased or absent breath sounds. Gastrointestinal: Bowel sounds 4 quadrants. Soft and nontender to palpation. No guarding or rigidity. No palpable masses. No distention. No CVA tenderness. Musculoskeletal: Full range of motion to all extremities. No gross deformities appreciated. Neurologic:  Normal speech and language. No gross focal neurologic deficits are appreciated.  Skin:  Skin is warm, dry and intact. No rash noted. Psychiatric: Mood and affect are normal. Speech and behavior are normal. Patient exhibits appropriate insight and judgement.   ____________________________________________   LABS (all labs ordered are listed, but only abnormal results are displayed)  Labs Reviewed - No data to display ____________________________________________  EKG   ____________________________________________  RADIOLOGY   No results found.  ____________________________________________    PROCEDURES  Procedure(s) performed:    Procedures    Medications  cefTRIAXone (ROCEPHIN) injection 500 mg (has no administration in time range)  lidocaine (PF) (XYLOCAINE) 1 % injection 5 mL (has no administration in time range)     ____________________________________________   INITIAL IMPRESSION / ASSESSMENT AND PLAN / ED COURSE  Pertinent labs & imaging results that were available during my care of the patient were reviewed by me and considered in my medical decision making (see chart for details).  Review of the [place] CSRS was performed in accordance of the [facility] prior to dispensing any controlled drugs.           Patient's diagnosis is consistent with gonorrhea.  Patient was  seen last night for UTI-like symptoms.  He denied any chance of STD but unfortunately his test from last night returned positive for gonorrhea.  Patient is return for definitive treatment for gonorrhea.  He will have Rocephin and Doxy.  Follow-up primary care as needed. Patient is given ED precautions to return to the ED for any worsening or new symptoms.     ____________________________________________  FINAL CLINICAL IMPRESSION(S) / ED DIAGNOSES  Final diagnoses:  Gonorrhea      NEW MEDICATIONS STARTED DURING THIS VISIT:  ED Discharge Orders          Ordered    doxycycline (VIBRA-TABS) 100 MG tablet  2 times daily        05/10/21 1644                This chart was dictated using voice recognition software/Dragon. Despite best efforts to proofread, errors can occur which can change the meaning. Any change was purely unintentional.    05/12/21, PA-C 05/10/21 1645    05/12/21, MD 05/12/21  1628  

## 2021-12-29 ENCOUNTER — Other Ambulatory Visit: Payer: Self-pay

## 2021-12-29 ENCOUNTER — Emergency Department
Admission: EM | Admit: 2021-12-29 | Discharge: 2021-12-29 | Disposition: A | Discharge status: Home / Self Care | Payer: No Typology Code available for payment source | Attending: Emergency Medicine | Admitting: Emergency Medicine

## 2021-12-29 DIAGNOSIS — J029 Acute pharyngitis, unspecified: Secondary | ICD-10-CM | POA: Insufficient documentation

## 2021-12-29 DIAGNOSIS — H9201 Otalgia, right ear: Secondary | ICD-10-CM | POA: Insufficient documentation

## 2021-12-29 DIAGNOSIS — J020 Streptococcal pharyngitis: Secondary | ICD-10-CM

## 2021-12-29 LAB — GROUP A STREP BY PCR: Group A Strep by PCR: DETECTED — AB

## 2021-12-29 MED ORDER — AMOXICILLIN 875 MG PO TABS: 875.0000 mg | ORAL_TABLET | Freq: Two times a day (BID) | ORAL | 0 refills | Status: AC

## 2021-12-29 NOTE — ED Triage Notes (Signed)
Patient to ER via POV with complaints of right sided earache x2 and sore throat x3 days.

## 2021-12-29 NOTE — Discharge Instructions (Signed)
Take Amoxicillin twice daily for ten days.  

## 2021-12-29 NOTE — ED Provider Notes (Signed)
[facility] Provider Note  Patient Contact: 3:16 PM (approximate)   History   Otalgia   HPI  Cory Griffin. is a 36 y.o. male presents to the emergency department with right-sided pharyngitis and right ear pain for the past 3 to 4 days.  Patient states that his symptoms started with just pharyngitis.  Patient is able to speak in complete sentences and can manage his own secretions.  No discharge from the right ear.  No fever or chills.     Physical Exam   Triage Vital Signs: ED Triage Vitals  Enc Vitals Group     BP 12/29/21 1509 (!) 146/97     Pulse Rate 12/29/21 1509 97     Resp 12/29/21 1509 18     Temp 12/29/21 1509 98.8 F (37.1 C)     Temp Source 12/29/21 1509 Oral     SpO2 12/29/21 1509 97 %     Weight 12/29/21 1507 170 lb (77.1 kg)     Height 12/29/21 1507 6\' 1"  (1.854 m)     Head Circumference --      Peak Flow --      Pain Score 12/29/21 1507 7     Pain Loc --      Pain Edu? --      Excl. in GC? --     Most recent vital signs: Vitals:   12/29/21 1509  BP: (!) 146/97  Pulse: 97  Resp: 18  Temp: 98.8 F (37.1 C)  SpO2: 97%     General: Alert and in no acute distress. Eyes:  PERRL. EOMI. Head: No acute traumatic findings ENT:      Ears: TMs are effused bilaterally.       Nose: No congestion/rhinnorhea.      Mouth/Throat: Mucous membranes are moist. Posterior pharynx is erythematous on the right. Neck: No stridor. No cervical spine tenderness to palpation. Hematological/Lymphatic/Immunilogical: Palpable cervical lymphadenopathy. Cardiovascular:  Good peripheral perfusion Respiratory: Normal respiratory effort without tachypnea or retractions. Lungs CTAB. Good air entry to the bases with no decreased or absent breath sounds. Gastrointestinal: Bowel sounds 4 quadrants. Soft and nontender to palpation. No guarding or rigidity. No palpable masses. No distention. No CVA tenderness. Musculoskeletal: Full range of motion to all  extremities.  Neurologic:  No gross focal neurologic deficits are appreciated.  Skin:   No rash noted Other:   ED Results / Procedures / Treatments   Labs (all labs ordered are listed, but only abnormal results are displayed) Labs Reviewed  GROUP A STREP BY PCR      PROCEDURES:  Critical Care performed: No  Procedures   MEDICATIONS ORDERED IN ED: Medications - No data to display   IMPRESSION / MDM / ASSESSMENT AND PLAN / ED COURSE  I reviewed the triage vital signs and the nursing notes.                              Assessment and plan: Otalgia  Pharyngitis 36 year old male presents to the urgent care with pharyngitis and right-sided ear pain.  He tested positive for group A strep.  We will treat with amoxicillin twice daily for 10 days.  Tylenol and ibuprofen alternating were recommended for pharyngitis.  Return precautions were given to return with new or worsening symptoms.      FINAL CLINICAL IMPRESSION(S) / ED DIAGNOSES   Final diagnoses:  None     Rx /  DC Orders   ED Discharge Orders     None        Note:  This document was prepared using Dragon voice recognition software and may include unintentional dictation errors.   Pia Mau [place], PA-C 12/29/21 1623    Jene Every, MD 12/29/21 [phone]

## 2022-04-17 IMAGING — CT CT MAXILLOFACIAL W/O CM
3 of 6 series · 16 of 47 positions shown, 19 images · non-contrast
Comparison: None.

CLINICAL DATA: Facial trauma. Fall with laceration to right cheek.

EXAM:
CT MAXILLOFACIAL WITHOUT CONTRAST
TECHNIQUE: Multidetector CT imaging of the maxillofacial structures was
performed. Multiplanar CT image reconstructions were also generated.

[Series 3: maxilllofacial 2.0 hr40 3 · axial · 0.39mm/px · z∈[+1192,+1352]mm · 11 of 94 slices shown, 14 images]
[im 7/94  brain]
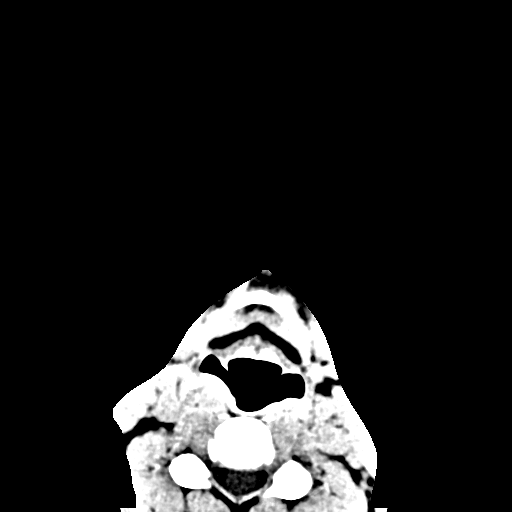
[im 7/94  bone]
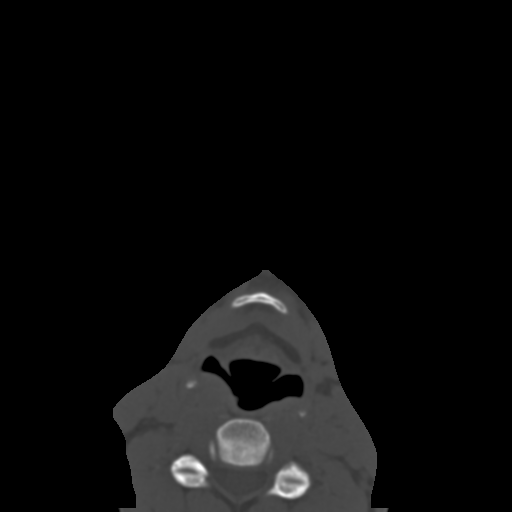
[im 14/94  bone]
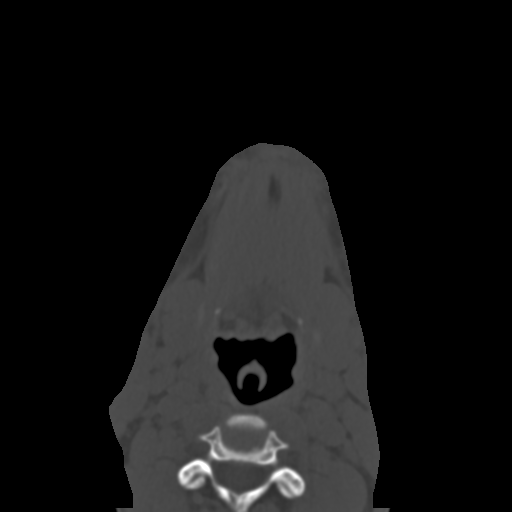
[im 20/94  bone]
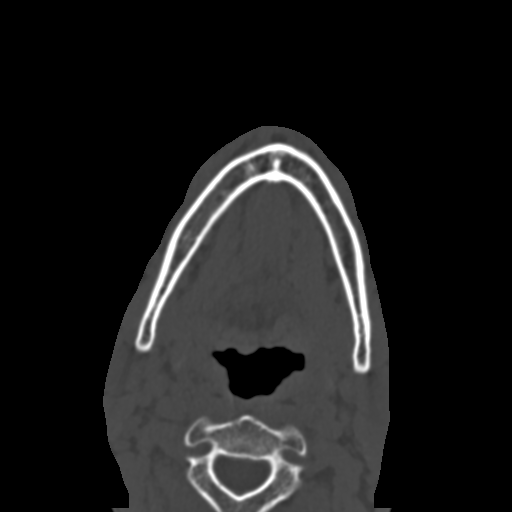
[im 34/94  bone]
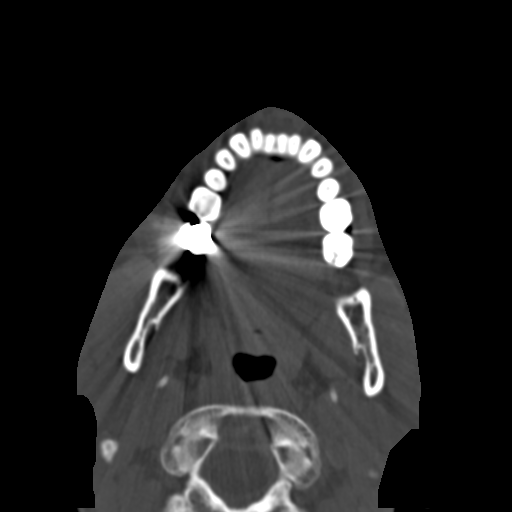
[im 40/94  brain]
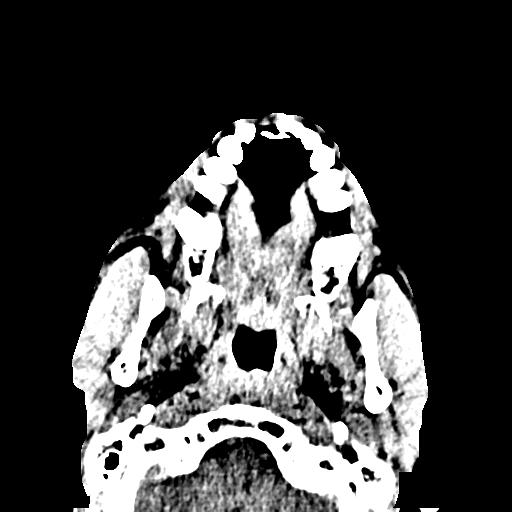
[im 40/94  bone]
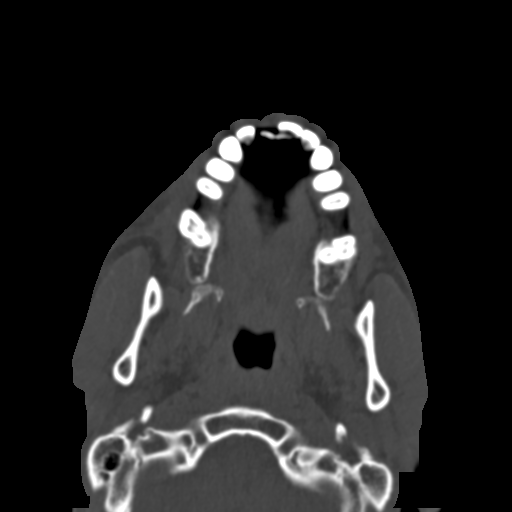
[im 47/94  bone]
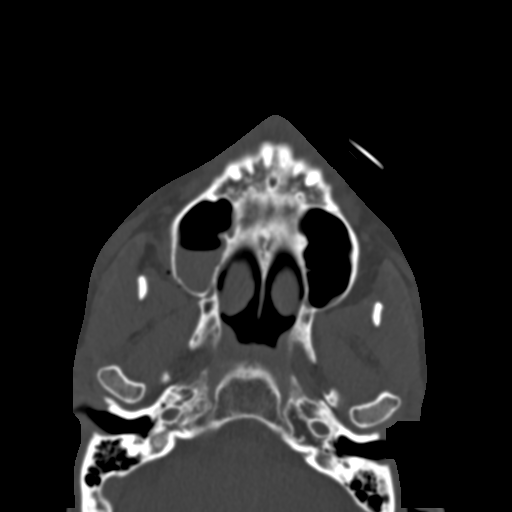
[im 54/94  bone]
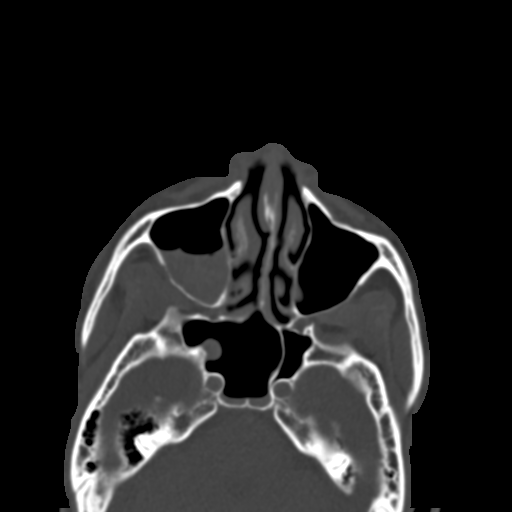
[im 60/94  bone]
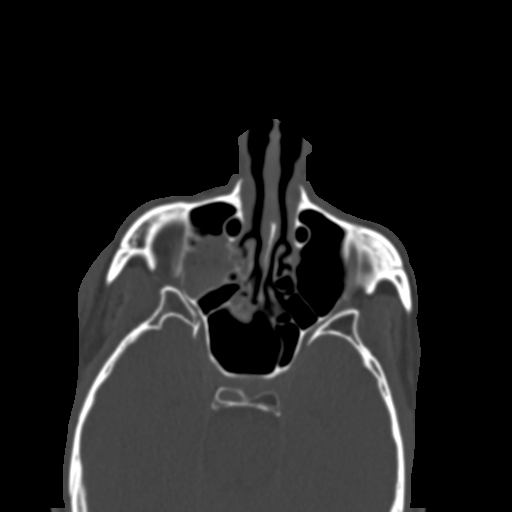
[im 74/94  brain]
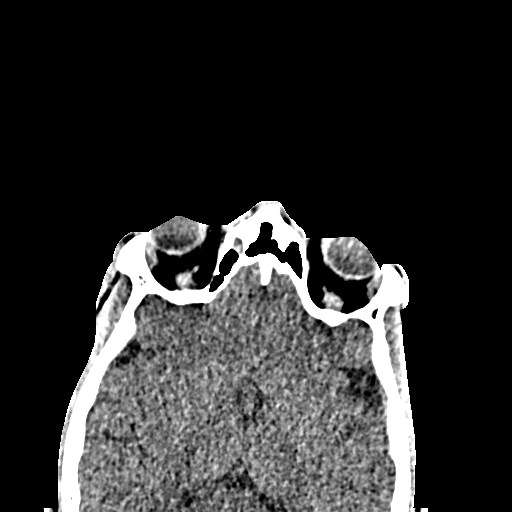
[im 74/94  bone]
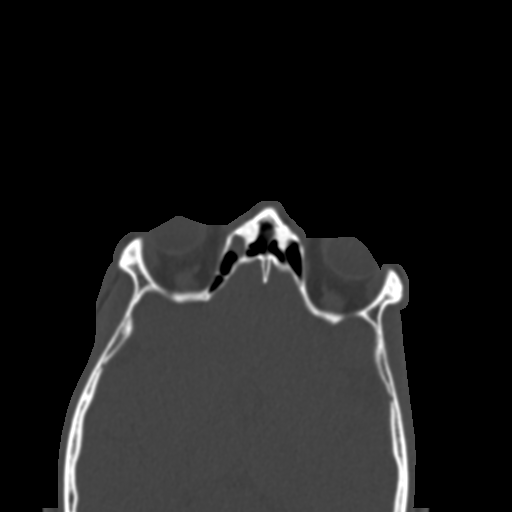
[im 80/94  bone]
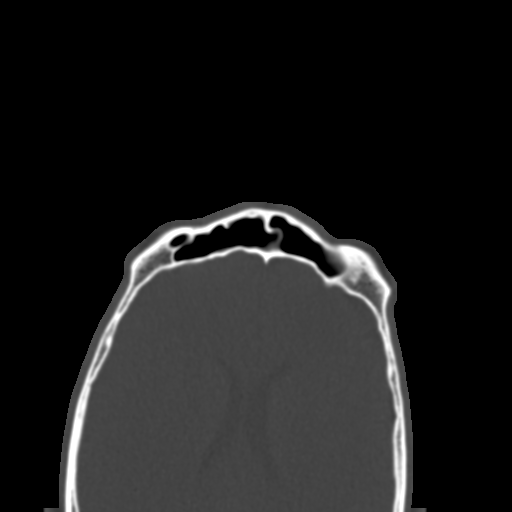
[im 87/94  bone]
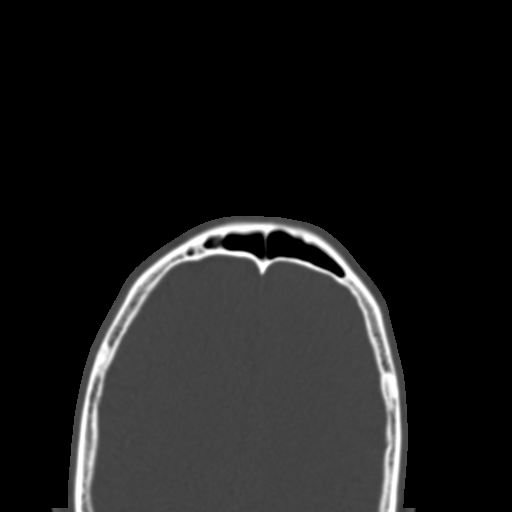

[Series 7: st cor · coronal · 0.38mm/px · 3 of 82 slices shown]
[im 21/82  bone]
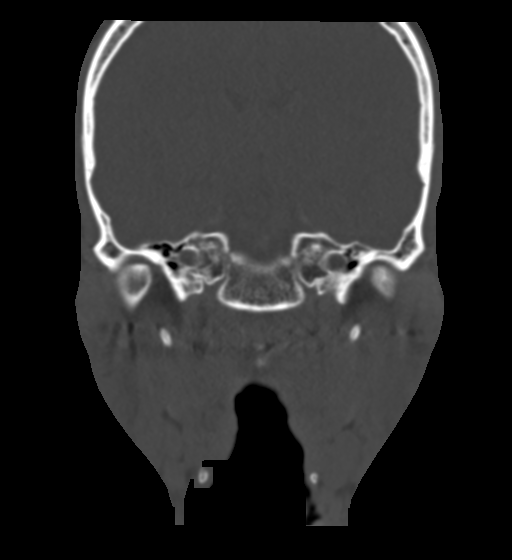
[im 41/82  bone]
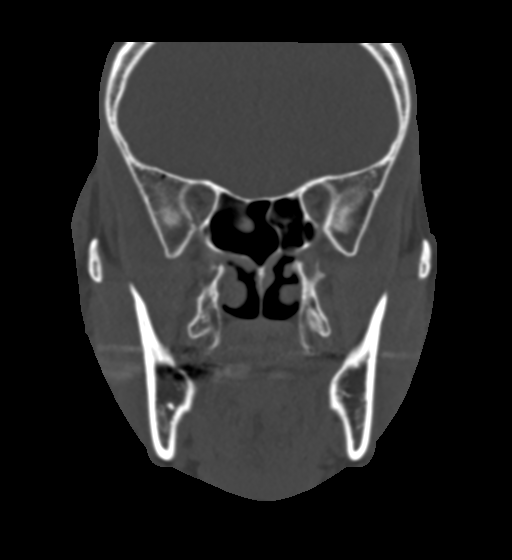
[im 61/82  bone]
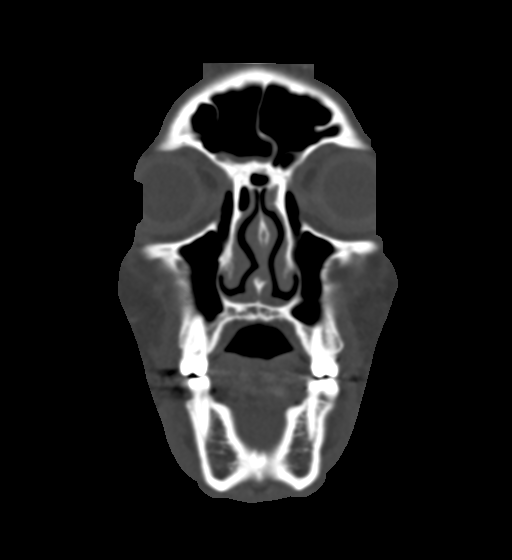

[Series 10: bone sag · sagittal · 0.45mm/px · 2 of 80 slices shown]
[im 27/80  bone]
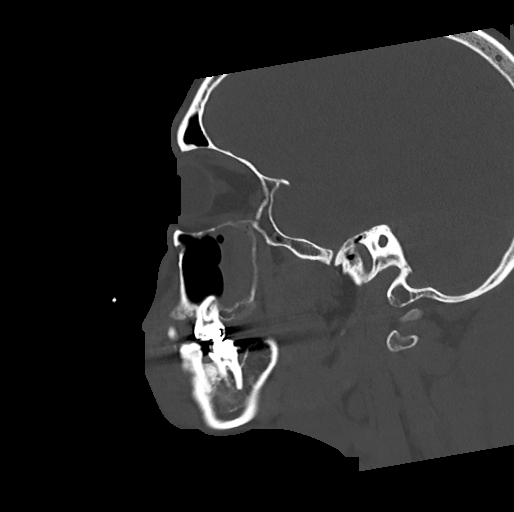
[im 53/80  bone]
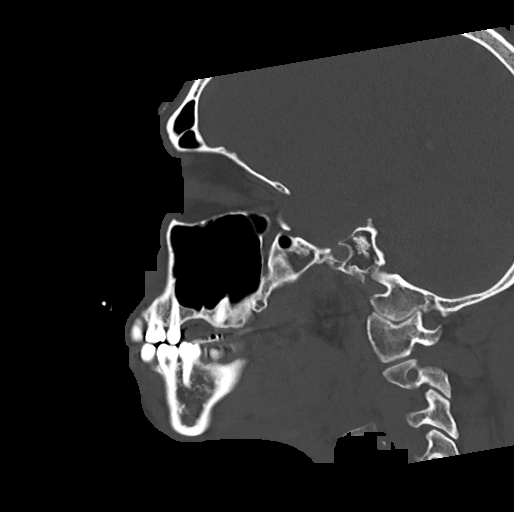

[16 of 47 positions shown; findings below may reference images not displayed]

FINDINGS: Osseous: No acute fracture of the nasal bone, zygomatic arches, or
mandibles. Slight leftward nasal septal deviation. Temporomandibular
joints are congruent. The right upper central incisor appears offset
and broken.

Orbits: Nondisplaced right inferior orbital wall fracture. No
extraocular muscle entrapment or globe injury. Left orbit and globe
are intact.

Sinuses: Right maxillary sinus fracture with fracture lines
involving the posterior and inferior wall as well as the
posterolateral wall and roof. Adjacent soft tissue air. Moderate
right hemosinus. Small fluid levels in the right side of sphenoid
sinus without sphenoid sinus fracture. Mild mucosal thickening of
left maxillary and right frontal sinus.

Soft tissues: Soft tissue edema overlies the right cheek. No
radiopaque foreign body.

Limited intracranial: No significant or unexpected finding.
IMPRESSION: 1. Nondisplaced right inferior orbital wall fracture. No extraocular
muscle entrapment or globe injury.
2. Right maxillary sinus fracture with fracture lines involving the
posterior and inferior wall as well as the posterolateral wall and
roof. Moderate right hemosinus.
3. The right upper central incisor appears offset and broken.

## 2022-04-17 IMAGING — DX DG WRIST COMPLETE 3+V*R*
3 series · 3 of 3 positions shown · non-contrast
Comparison: None.

CLINICAL DATA: Fall off roof with right wrist pain.

EXAM:
RIGHT WRIST - COMPLETE 3+ VIEW

[wrist pa]
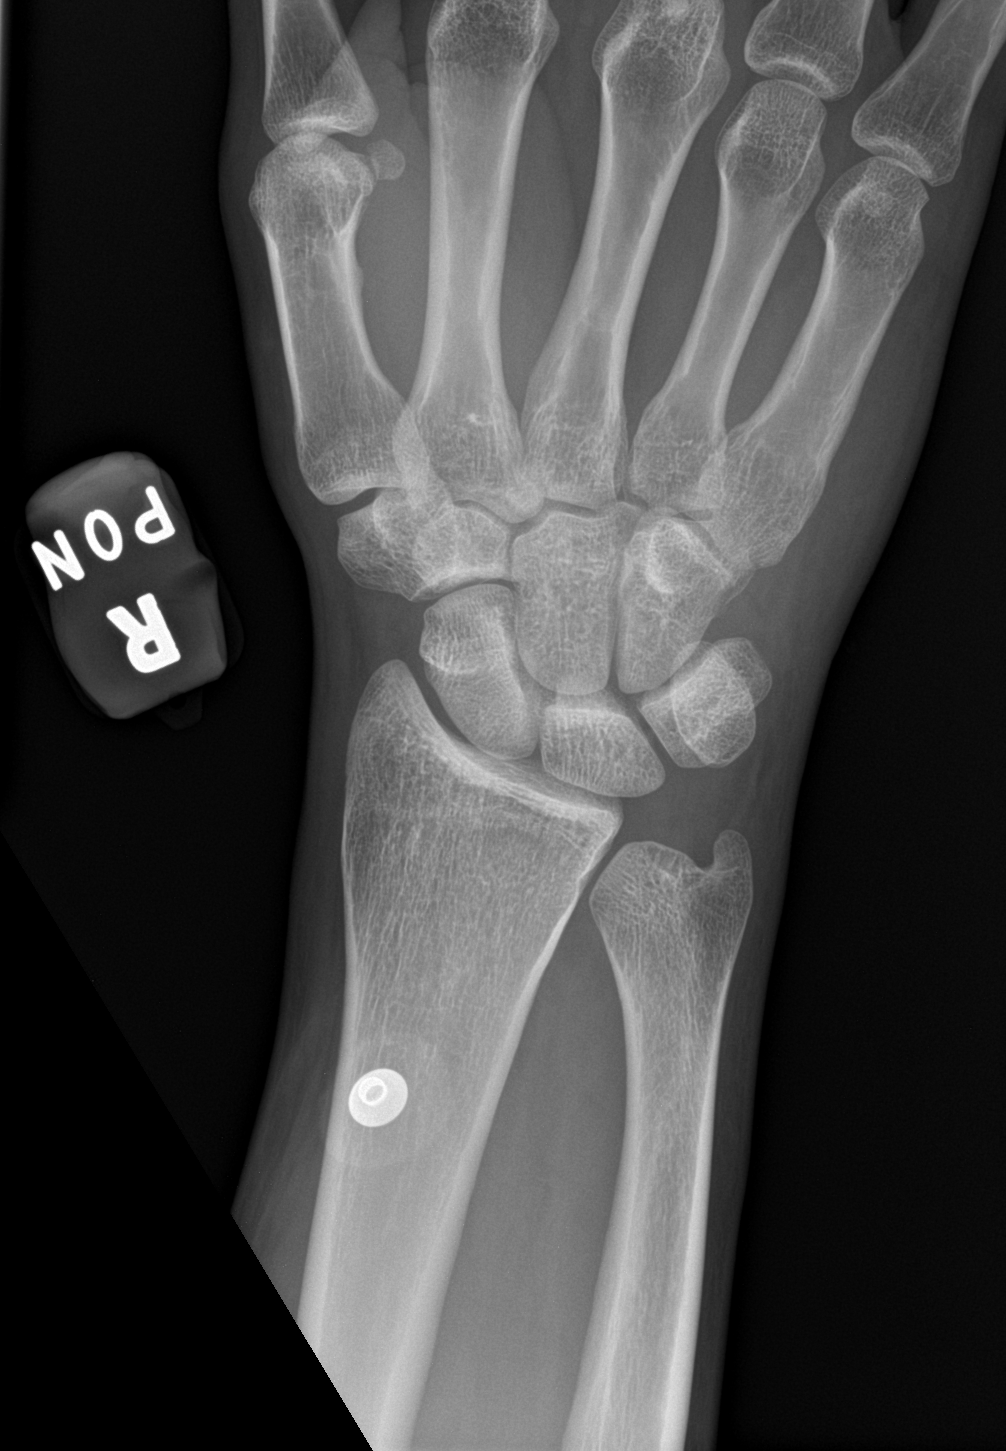

[wrist obl]
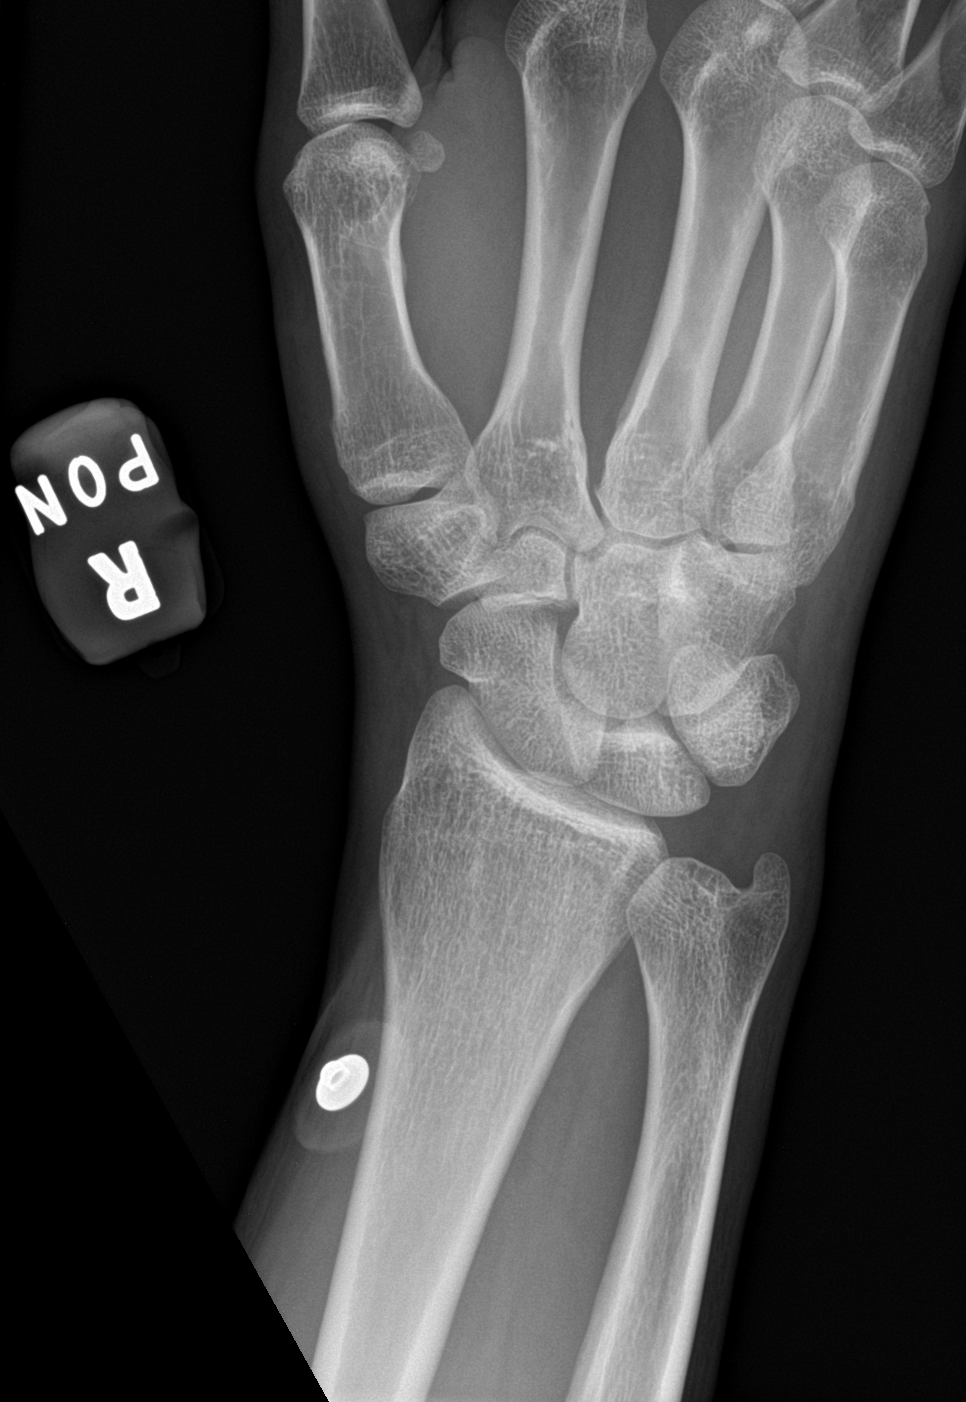

[wrist lat]
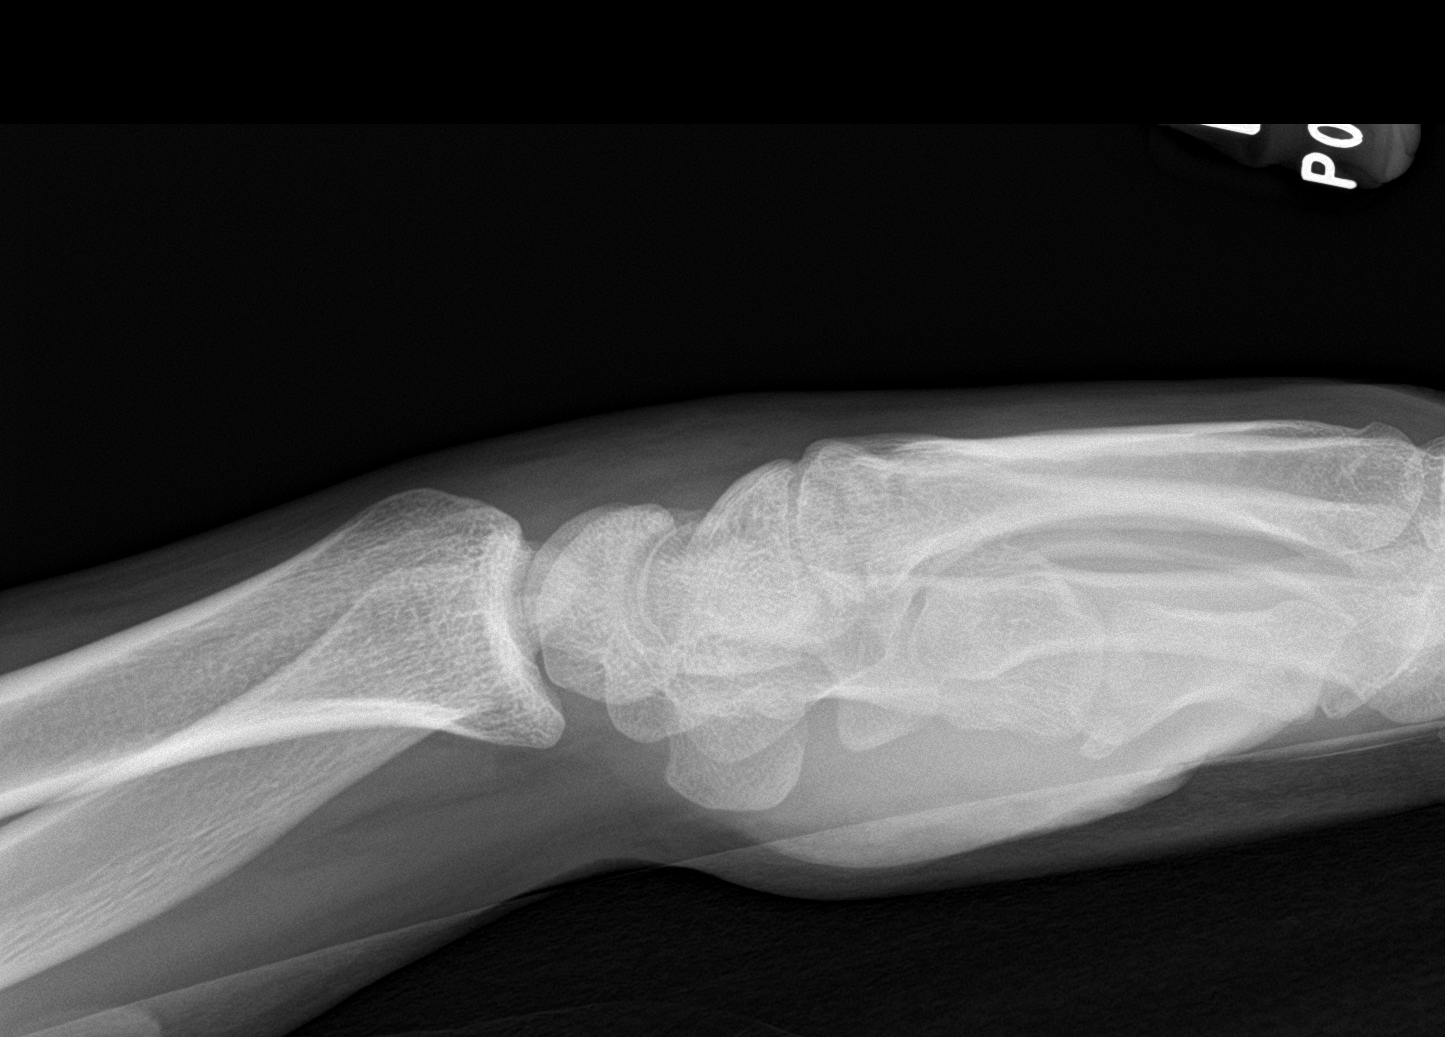

[3 of 3 positions shown; findings below may reference images not displayed]

FINDINGS: There is no evidence of fracture or dislocation. There is no
evidence of arthropathy or other focal bone abnormality. Soft
tissues are unremarkable.
IMPRESSION: Negative radiographs of the right wrist.

## 2022-04-17 IMAGING — DX DG HIP (WITH OR WITHOUT PELVIS) 2-3V*R*
3 series · 3 of 3 positions shown · non-contrast
Comparison: None.

CLINICAL DATA: Fall off a roof. Right hip pain.

EXAM:
DG HIP (WITH OR WITHOUT PELVIS) 2-3V RIGHT

[pelvis ap]
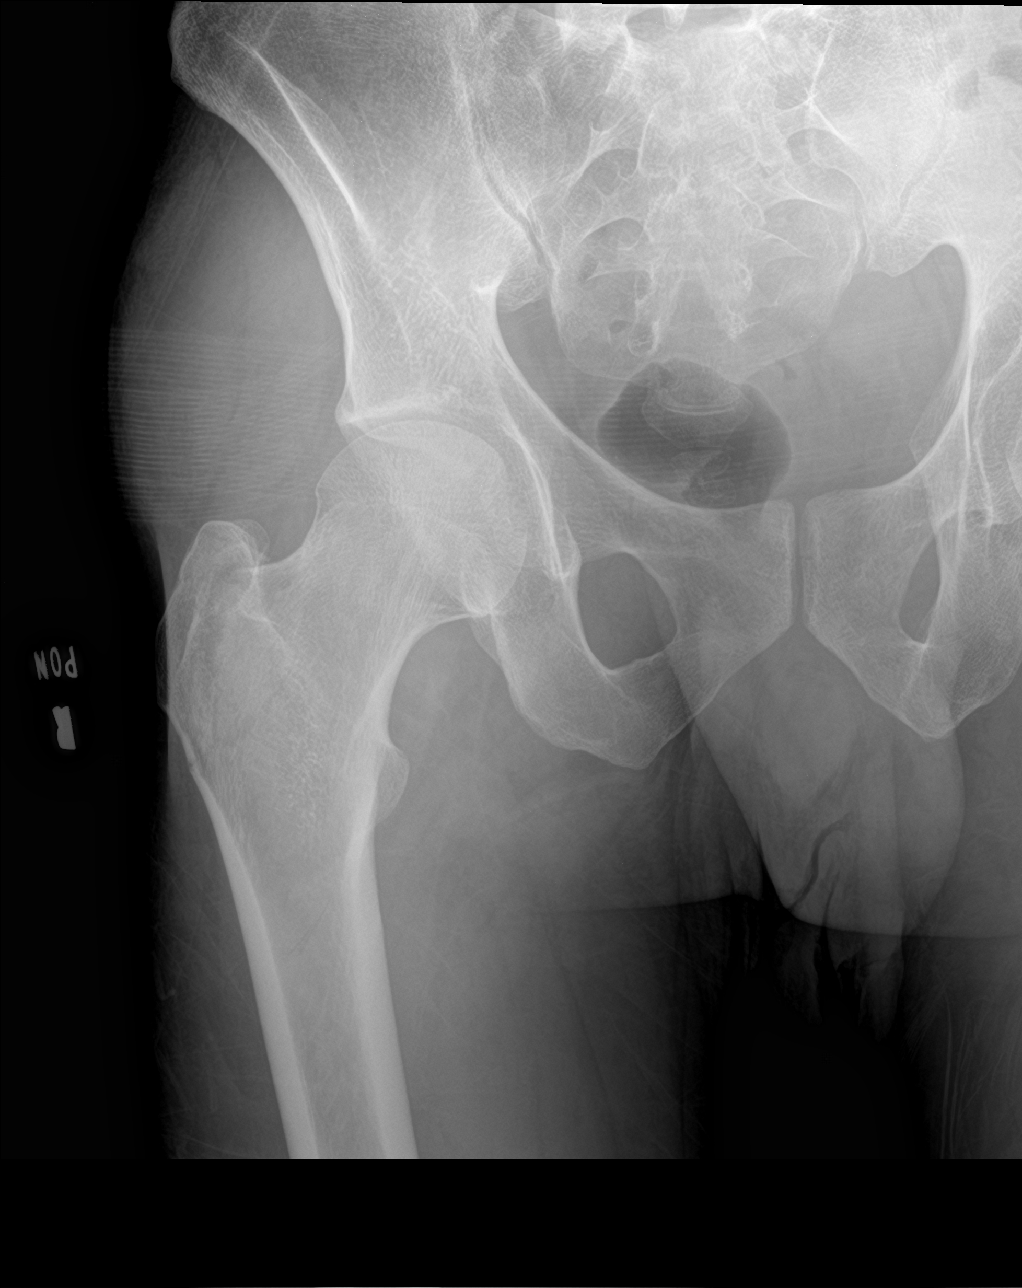

[hip ap]
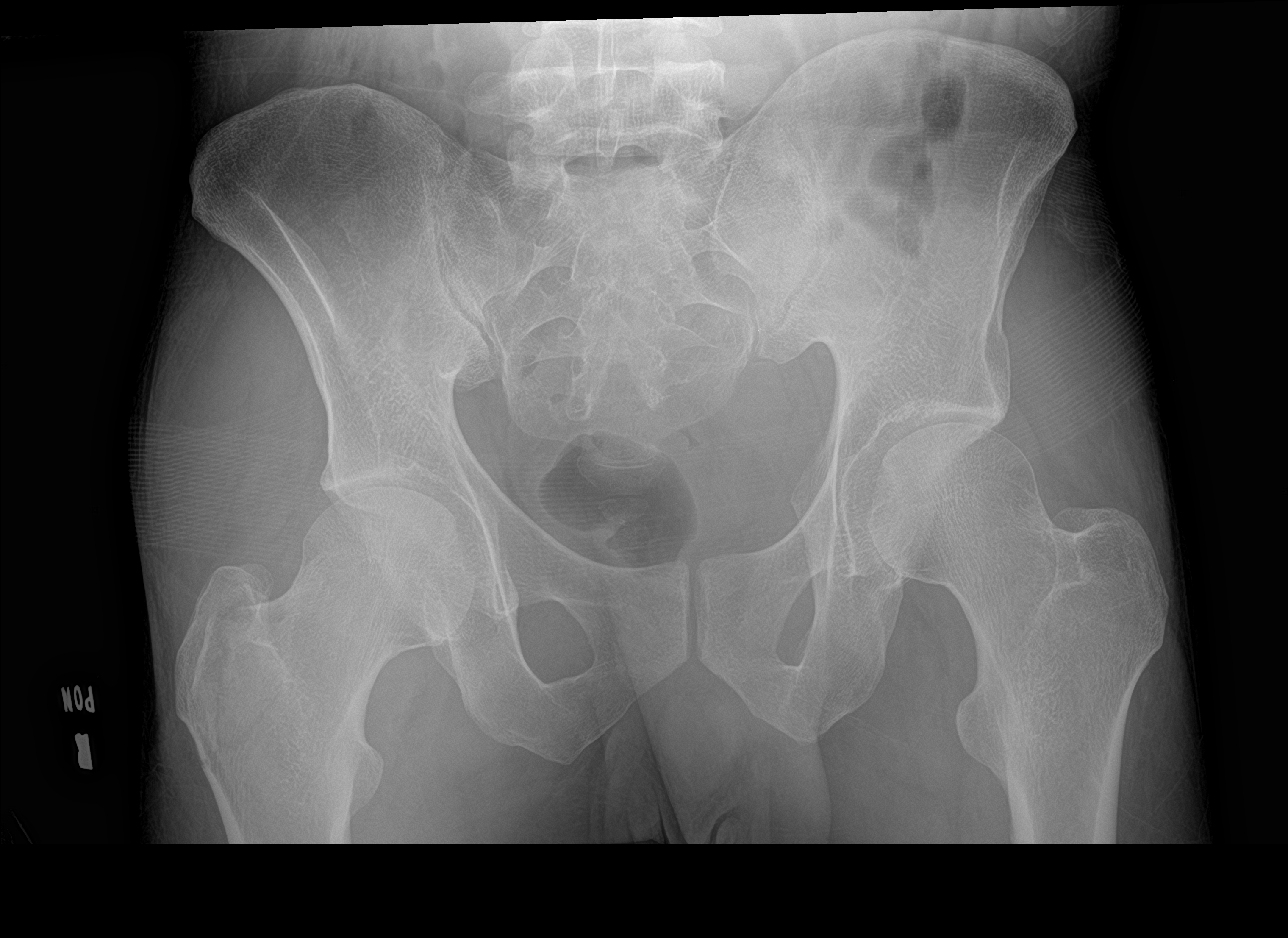

[hip lat]
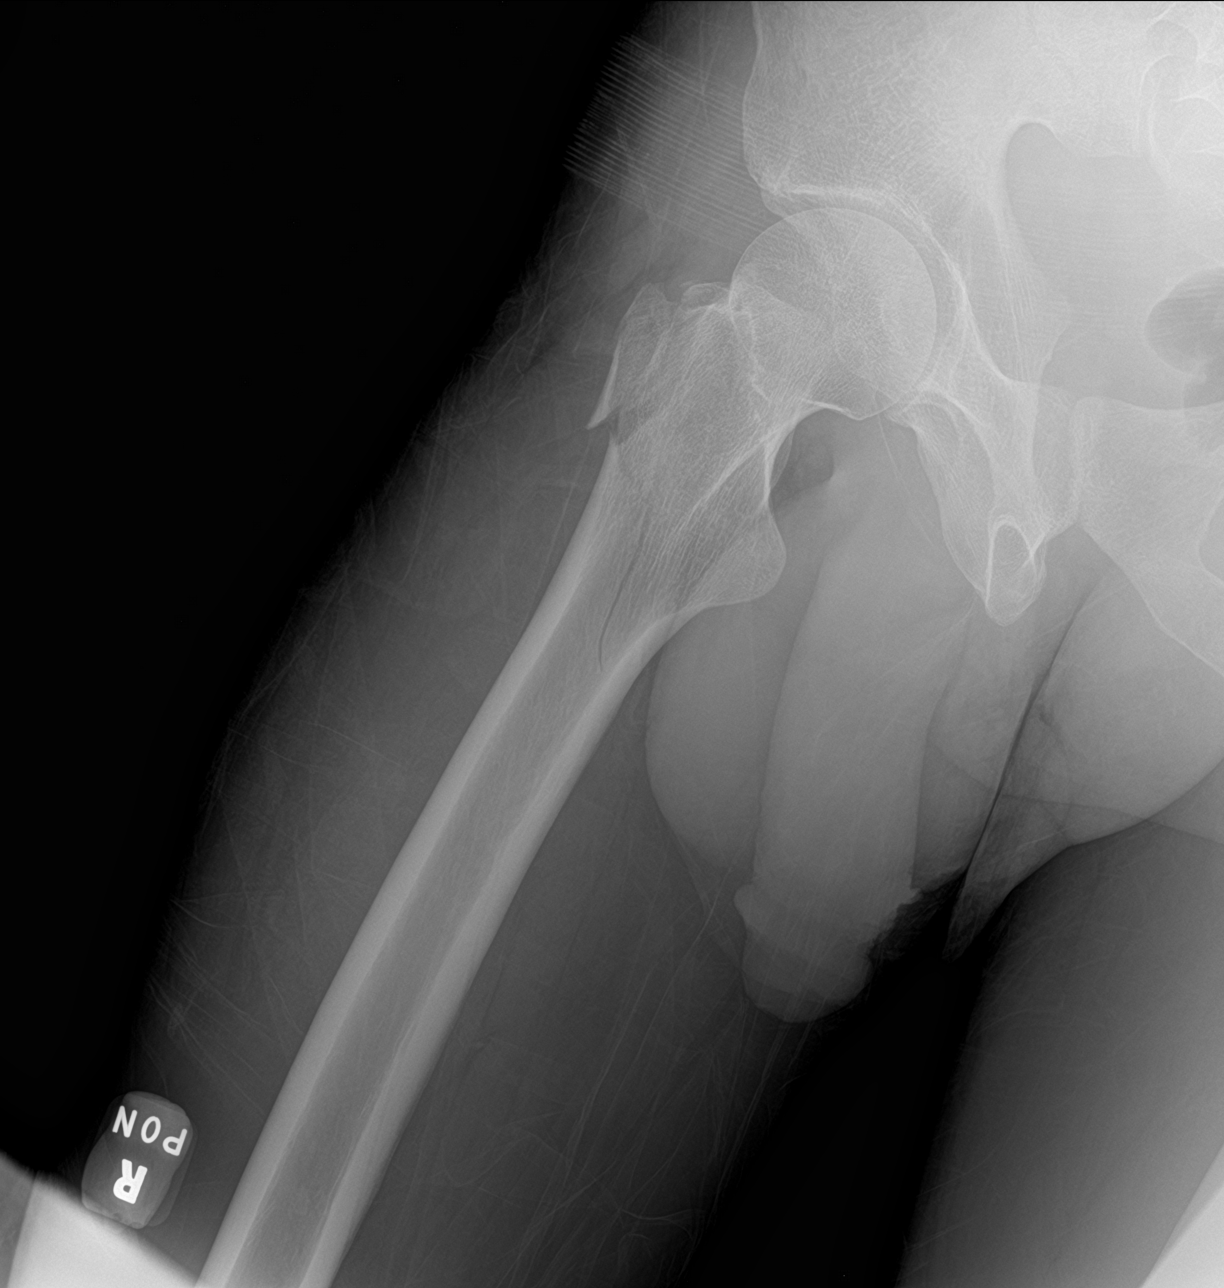

[3 of 3 positions shown; findings below may reference images not displayed]

FINDINGS: Mildly comminuted and displaced right proximal femur fracture
involving the greater trochanter involving the lateral cortex and
vertical extension into the subtrochanteric region. There is no
involvement of the femoral neck. Femoral head remains seated. Pubic
rami are intact. There is no additional fracture.
IMPRESSION: Mildly comminuted and displaced right proximal femur fracture
involving the greater trochanter and subtrochanteric region.

## 2022-07-17 DIAGNOSIS — R030 Elevated blood-pressure reading, without diagnosis of hypertension: Secondary | ICD-10-CM

## 2022-07-17 DIAGNOSIS — A64 Unspecified sexually transmitted disease: Secondary | ICD-10-CM

## 2022-07-17 HISTORY — DX: Unspecified sexually transmitted disease: A64

## 2022-07-17 HISTORY — DX: Elevated blood-pressure reading, without diagnosis of hypertension: R03.0

## 2023-01-22 ENCOUNTER — Encounter: Payer: Self-pay | Admitting: Emergency Medicine

## 2023-01-22 ENCOUNTER — Other Ambulatory Visit: Payer: Self-pay

## 2023-01-22 ENCOUNTER — Emergency Department
Admission: EM | Admit: 2023-01-22 | Discharge: 2023-01-22 | Disposition: A | Discharge status: Home / Self Care | Payer: PRIVATE HEALTH INSURANCE | Attending: Emergency Medicine | Admitting: Emergency Medicine

## 2023-01-22 DIAGNOSIS — X500XXA Overexertion from strenuous movement or load, initial encounter: Secondary | ICD-10-CM | POA: Diagnosis not present

## 2023-01-22 DIAGNOSIS — S76211A Strain of adductor muscle, fascia and tendon of right thigh, initial encounter: Secondary | ICD-10-CM

## 2023-01-22 DIAGNOSIS — S79921A Unspecified injury of right thigh, initial encounter: Secondary | ICD-10-CM | POA: Diagnosis present

## 2023-01-22 DIAGNOSIS — S76811A Strain of other specified muscles, fascia and tendons at thigh level, right thigh, initial encounter: Secondary | ICD-10-CM | POA: Diagnosis not present

## 2023-01-22 MED ORDER — MELOXICAM 15 MG PO TABS: 15.0000 mg | ORAL_TABLET | Freq: Every day | ORAL | 0 refills | Status: DC

## 2023-01-22 MED ORDER — CYCLOBENZAPRINE HCL 10 MG PO TABS: 10.0000 mg | ORAL_TABLET | Freq: Three times a day (TID) | ORAL | 0 refills | Status: DC | PRN

## 2023-01-22 NOTE — Discharge Instructions (Signed)
Follow-up with your regular doctor as needed.  Return emergency department worsening. If the area begins to bulging you cannot decrease the swelling on your own please return emergency department so we can examine you for a hernia. If the medication relieves your symptoms then it is most likely just a inguinal strain.  Apply ice to the area.

## 2023-01-22 NOTE — ED Provider Notes (Signed)
[facility] Provider Note    Event Date/Time   First MD Initiated Contact with Patient 01/22/23 1228     (approximate)   History   Groin Pain   HPI  Cory L Dorland Wolbers. is a 37 y.o. male with no significant past medical history presents emergency department stating he feels like he pulled a muscle in his groin area on the right side.  Patient was at work.  States he does a lot of lifting.  Got off of his machine when he was walking and felt like a pop occurred in the right inguinal area.  States had some bulging but it does go down.  Some pain is relieved with heat.  Pain is reproduced with movement.  Denies numbness or tingling.  No difficulty with bowel movement.  No dysuria or hematuria      Physical Exam   Triage Vital Signs: ED Triage Vitals  Enc Vitals Group     BP 01/22/23 1218 (!) 153/102     Pulse Rate 01/22/23 1218 77     Resp 01/22/23 1218 18     Temp 01/22/23 1218 98 F (36.7 C)     Temp src --      SpO2 01/22/23 1218 100 %     Weight 01/22/23 1233 169 lb 15.6 oz (77.1 kg)     Height 01/22/23 1233 6\' 1"  (1.854 m)     Head Circumference --      Peak Flow --      Pain Score 01/22/23 1218 10     Pain Loc --      Pain Edu? --      Excl. in GC? --     Most recent vital signs: Vitals:   01/22/23 1218  BP: (!) 153/102  Pulse: 77  Resp: 18  Temp: 98 F (36.7 C)  SpO2: 100%     General: Awake, no distress.   CV:  Good peripheral perfusion. regular rate and  rhythm Resp:  Normal effort.  Abd:  No distention.  Nontender, area at the right ankle area is tender to palpation, no large hernia noted, tenderness when examined for inguinal hernia but no bulge felt Other:      ED Results / Procedures / Treatments   Labs (all labs ordered are listed, but only abnormal results are displayed) Labs Reviewed - No data to display   EKG     RADIOLOGY     PROCEDURES:   Procedures   MEDICATIONS ORDERED IN ED: Medications - No  data to display   IMPRESSION / MDM / ASSESSMENT AND PLAN / ED COURSE  I reviewed the triage vital signs and the nursing notes.                              Differential diagnosis includes, but is not limited to, groin strain, inguinal hernia, hernia, fracture  Patient's presentation is most consistent with acute complicated illness / injury requiring diagnostic workup.   In shared decision making the patient would like to try conservative therapy and get ct later if symptoms do not improve, I agree this would be appropriate however, I did give him strict instructions to return if worsening, did discuss signs and symptoms of hernia and gave him written information.  Patient was given mobic 15mg  and flexeril at discharge.  Return if worsening.  Patient is in agreement, discharged in stable condition  FINAL CLINICAL IMPRESSION(S) / ED DIAGNOSES   Final diagnoses:  Inguinal strain, right, initial encounter     Rx / DC Orders   ED Discharge Orders          Ordered    meloxicam (MOBIC) 15 MG tablet  Daily        01/22/23 1245    cyclobenzaprine (FLEXERIL) 10 MG tablet  3 times daily PRN        01/22/23 1245             Note:  This document was prepared using Dragon voice recognition software and may include unintentional dictation errors.    Faythe Ghee, PA-C 01/22/23 1254    Jene Every, MD 01/22/23 1255

## 2023-01-22 NOTE — ED Triage Notes (Signed)
Pt comes with c/o possible pulled muscle in groin area to right aide. Pt states this all started last week. Pt states he heard something pop. Pt states no relief with heating pad or meds.   Pt denies any urinary symptoms. Pt states noticeable swelling to area.

## 2023-01-22 NOTE — ED Notes (Signed)
See triage note  Presents with pain to right groin area  States he thinks he pulled a muscle couple of days ago    Ambulates with slight limp d/t pain

## 2023-03-18 DIAGNOSIS — K4090 Unilateral inguinal hernia, without obstruction or gangrene, not specified as recurrent: Secondary | ICD-10-CM

## 2023-03-18 HISTORY — DX: Unilateral inguinal hernia, without obstruction or gangrene, not specified as recurrent: K40.90

## 2023-04-03 ENCOUNTER — Emergency Department
Admission: EM | Admit: 2023-04-03 | Discharge: 2023-04-03 | Disposition: A | Discharge status: Home / Self Care | Payer: Medicaid Other | Attending: Emergency Medicine | Admitting: Emergency Medicine

## 2023-04-03 ENCOUNTER — Other Ambulatory Visit: Payer: Self-pay

## 2023-04-03 ENCOUNTER — Emergency Department: Payer: Medicaid Other

## 2023-04-03 DIAGNOSIS — K4090 Unilateral inguinal hernia, without obstruction or gangrene, not specified as recurrent: Secondary | ICD-10-CM

## 2023-04-03 DIAGNOSIS — R1031 Right lower quadrant pain: Secondary | ICD-10-CM | POA: Diagnosis not present

## 2023-04-03 LAB — CBC WITH DIFFERENTIAL/PLATELET
Abs Immature Granulocytes: 0.01 10*3/uL (ref 0.00–0.07)
Basophils Absolute: 0.1 10*3/uL (ref 0.0–0.1)
Basophils Relative: 1 %
Eosinophils Absolute: 0.1 10*3/uL (ref 0.0–0.5)
Eosinophils Relative: 2 %
HCT: 44.5 % (ref 39.0–52.0)
Hemoglobin: 15.5 g/dL (ref 13.0–17.0)
Immature Granulocytes: 0 %
Lymphocytes Relative: 26 %
Lymphs Abs: 1.9 10*3/uL (ref 0.7–4.0)
MCH: 33.8 pg (ref 26.0–34.0)
MCHC: 34.8 g/dL (ref 30.0–36.0)
MCV: 97.2 fL (ref 80.0–100.0)
Monocytes Absolute: 0.5 10*3/uL (ref 0.1–1.0)
Monocytes Relative: 6 %
Neutro Abs: 4.8 10*3/uL (ref 1.7–7.7)
Neutrophils Relative %: 65 %
Platelets: 215 10*3/uL (ref 150–400)
RBC: 4.58 MIL/uL (ref 4.22–5.81)
RDW: 11 % — ABNORMAL LOW (ref 11.5–15.5)
WBC: 7.4 10*3/uL (ref 4.0–10.5)
nRBC: 0 % (ref 0.0–0.2)

## 2023-04-03 LAB — BASIC METABOLIC PANEL
Anion gap: 9 (ref 5–15)
BUN: 10 mg/dL (ref 6–20)
CO2: 25 mmol/L (ref 22–32)
Calcium: 9.2 mg/dL (ref 8.9–10.3)
Chloride: 104 mmol/L (ref 98–111)
Creatinine, Ser: 0.87 mg/dL (ref 0.61–1.24)
GFR, Estimated: >60 mL/min (ref >60)
Glucose, Bld: 96 mg/dL (ref 70–99)
Potassium: 4 mmol/L (ref 3.5–5.1)
Sodium: 138 mmol/L (ref 135–145)

## 2023-04-03 MED ORDER — IOHEXOL 300 MG/ML  SOLN
100.0000 mL | Freq: Once | INTRAMUSCULAR | Status: AC | PRN
Start: 2023-04-03 — End: 2023-04-03
  Administered 2023-04-03: 100 mL via INTRAVENOUS

## 2023-04-03 NOTE — ED Provider Triage Note (Signed)
Emergency Medicine Provider Triage Evaluation Note  Cory Griffin. , a 37 y.o. male  was evaluated in triage.  Pt complains of groin pain and swelling which has been bothering him for a while. He states it has gotten worse in the past few days. He notes it is worse with activity. He reports swelling to his groin but no overlying skin changes.  Review of Systems  Positive: Groin pain Negative: Leg weakness, numbness, tingling  Physical Exam  BP (!) 132/118   Pulse 77   Temp 98 F (36.7 C)   Resp 18   Ht 6' (1.829 m)   Wt 77.1 kg   SpO2 100%   BMI 23.06 kg/m  Gen:   Awake, no distress   Resp:  Normal effort  MSK:   Moves extremities without difficulty   Medical Decision Making  Medically screening exam initiated at 11:52 AM.  Appropriate orders placed.  Cory Griffin. was informed that the remainder of the evaluation will be completed by another provider, this initial triage assessment does not replace that evaluation, and the importance of remaining in the ED until their evaluation is complete.     Cameron Ali, PA-C 04/03/23 1153

## 2023-04-03 NOTE — ED Triage Notes (Addendum)
Pt comes with c/o right upper groin pain for several weeks. Pt states he was seen here in eD for it and given pain meds with some relief. Pt states it is now hurting again and not sure if he injured something or may have hernia.  Pt denies any urinary symptoms.

## 2023-04-03 NOTE — ED Provider Notes (Cosign Needed Addendum)
[facility] Provider Note    Event Date/Time   First MD Initiated Contact with Patient 04/03/23 1226     (approximate)   History   Groin Pain   HPI  Cory Griffin. is a 37 y.o. male   presents to the ED with complaint of recurrent right groin pain that started last week.  Patient was seen in the emergency department 01/22/2023 at which time a CT was deferred and patient took meloxicam and Flexeril with relief of his discomfort.  He states that yesterday when he returned to work from the weekend that he began hurting.  He reports that he occasionally feels a bulge which reduces with touching it or lying down.      Physical Exam   Triage Vital Signs: ED Triage Vitals  Encounter Vitals Group     BP 04/03/23 1108 (!) 132/118     Systolic BP Percentile --      Diastolic BP Percentile --      Pulse Rate 04/03/23 1108 77     Resp 04/03/23 1108 18     Temp 04/03/23 1108 98 F (36.7 C)     Temp src --      SpO2 04/03/23 1108 100 %     Weight 04/03/23 1134 170 lb (77.1 kg)     Height 04/03/23 1134 6' (1.829 m)     Head Circumference --      Peak Flow --      Pain Score 04/03/23 1107 4     Pain Loc --      Pain Education --      Exclude from Growth Chart --     Most recent vital signs: Vitals:   04/03/23 1507 04/03/23 1510  BP: 130/88 130/88  Pulse:  70  Resp:  18  Temp:  98 F (36.7 C)  SpO2:  100%     General: Awake, no distress.  CV:  Good peripheral perfusion.  Resp:  Normal effort.  Abd:  No distention.  Other:  Soft, flat, bowel sounds normoactive x 4 quadrants.  There is some mild tenderness on palpation of the right lower quadrant and inguinal area.  No obvious bulging noted at this time.   ED Results / Procedures / Treatments   Labs (all labs ordered are listed, but only abnormal results are displayed) Labs Reviewed  CBC WITH DIFFERENTIAL/PLATELET - Abnormal; Notable for the following components:      Result Value   RDW  11.0 (*)    All other components within normal limits  BASIC METABOLIC PANEL     RADIOLOGY CT scan abdomen pelvis per radiologist shows a tiny fat-containing umbilical hernia.  Normal appendix.  No acute findings.    PROCEDURES:  Critical Care performed:   Procedures   MEDICATIONS ORDERED IN ED: Medications  iohexol (OMNIPAQUE) 300 MG/ML solution 100 mL (100 mLs Intravenous Contrast Given 04/03/23 1340)     IMPRESSION / MDM / ASSESSMENT AND PLAN / ED COURSE  I reviewed the triage vital signs and the nursing notes.   Differential diagnosis includes, but is not limited to, abdominal pain, abdominal muscle strain, inguinal strain, inguinal hernia, rule out inguinal hernia strangulation.   37 year old male presents to the ED with complaint of right lower quadrant/groin pain that originally started several weeks ago and was seen in the emergency department.  Patient states that he got relief with meloxicam and after he ran out several days later his pain returned.  Patient denies any known injury.  It was discussed at his last visit that this possibly was an inguinal hernia.  He agrees to have a CT scan done today.  Patient elected to leave prior to results of CT scan results.  He reports that he will get this from "MyChart".  He is instructed to call make an appointment with Dr. Aleen Campi who is on-call for surgery if needed for inguinal hernia.  No medications were prescribed.  Patient was noted to be walking in the hallway upright without any assistance and without difficulty prior to discharge.  He is to return to the emergency department if any severe worsening of his symptoms.     Clinical Course as of 04/03/23 1545  Tue Apr 03, 2023  1302 CT ABDOMEN PELVIS W CONTRAST [HD]    Clinical Course User Index [HD] Lynda Rainwater, Student-PA   Patient's presentation is most consistent with acute illness / injury with system symptoms.  FINAL CLINICAL IMPRESSION(S) / ED DIAGNOSES    Final diagnoses:  Groin pain, right     Rx / DC Orders   ED Discharge Orders     None        Note:  This document was prepared using Dragon voice recognition software and may include unintentional dictation errors.   Tommi Rumps, PA-C 04/03/23 1545    Tommi Rumps, PA-C 04/03/23 1546    Sharman Cheek, MD 04/04/23 [phone]

## 2023-04-03 NOTE — Discharge Instructions (Addendum)
Follow-up with your primary care provider if any continued problems and also have your blood pressure rechecked as it was elevated in the emergency department at 132/118.  The results of your CT scan have not been received from the radiologist at this time.  You can see the results of your test on MyChart.

## 2023-04-06 ENCOUNTER — Encounter: Payer: Self-pay | Admitting: Surgery

## 2023-04-06 ENCOUNTER — Ambulatory Visit (INDEPENDENT_AMBULATORY_CARE_PROVIDER_SITE_OTHER): Payer: PRIVATE HEALTH INSURANCE | Admitting: Surgery

## 2023-04-06 VITALS — BP 148/102 | HR 91 | Temp 98.5°F | Ht 73.0 in | Wt 167.0 lb

## 2023-04-06 DIAGNOSIS — K429 Umbilical hernia without obstruction or gangrene: Secondary | ICD-10-CM | POA: Diagnosis not present

## 2023-04-06 DIAGNOSIS — K4090 Unilateral inguinal hernia, without obstruction or gangrene, not specified as recurrent: Secondary | ICD-10-CM

## 2023-04-06 NOTE — Patient Instructions (Signed)
You have chose to have your hernia repaired. This will be done by Dr. Aleen Campi at [facility].  Please see your (blue) Pre-care information that you have been given today. Our surgery scheduler will call you to verify surgery date and to go over information.   You will need to arrange to be out of work for approximately 1-2 weeks and then you may return with a lifting restriction for 4 more weeks. If you have FMLA or Disability paperwork that needs to be filled out, please have your company fax your paperwork to [phone] or you may drop this by either office. This paperwork will be filled out within 3 days after your surgery has been completed.  You may have a bruise in your groin and also swelling and brusing in your testicle area. You may use ice 4-5 times daily for 15-20 minutes each time. Make sure that you place a barrier between you and the ice pack. To decrease the swelling, you may roll up a bath towel and place it vertically in between your thighs with your testicles resting on the towel. You will want to keep this area elevated as much as possible for several days following surgery.    Inguinal Hernia, Adult Muscles help keep everything in the body in its proper place. But if a weak spot in the muscles develops, something can poke through. That is called a hernia. When this happens in the lower part of the belly (abdomen), it is called an inguinal hernia. (It takes its name from a part of the body in this region called the inguinal canal.) A weak spot in the wall of muscles lets some fat or part of the small intestine bulge through. An inguinal hernia can develop at any age. Men get them more often than women. CAUSES  In adults, an inguinal hernia develops over time. It can be triggered by: Suddenly straining the muscles of the lower abdomen. Lifting heavy objects. Straining to have a bowel movement. Difficult bowel movements (constipation) can lead to this. Constant coughing. This may be  caused by smoking or lung disease. Being overweight. Being pregnant. Working at a job that requires long periods of standing or heavy lifting. Having had an inguinal hernia before. One type can be an emergency situation. It is called a strangulated inguinal hernia. It develops if part of the small intestine slips through the weak spot and cannot get back into the abdomen. The blood supply can be cut off. If that happens, part of the intestine may die. This situation requires emergency surgery. SYMPTOMS  Often, a small inguinal hernia has no symptoms. It is found when a healthcare provider does a physical exam. Larger hernias usually have symptoms.  In adults, symptoms may include: A lump in the groin. This is easier to see when the person is standing. It might disappear when lying down. In men, a lump in the scrotum. Pain or burning in the groin. This occurs especially when lifting, straining or coughing. A dull ache or feeling of pressure in the groin. Signs of a strangulated hernia can include: A bulge in the groin that becomes very painful and tender to the touch. A bulge that turns red or purple. Fever, nausea and vomiting. Inability to have a bowel movement or to pass gas. DIAGNOSIS  To decide if you have an inguinal hernia, a healthcare provider will probably do a physical examination. This will include asking questions about any symptoms you have noticed. The healthcare provider might feel  the groin area and ask you to cough. If an inguinal hernia is felt, the healthcare provider may try to slide it back into the abdomen. Usually no other tests are needed. TREATMENT  Treatments can vary. The size of the hernia makes a difference. Options include: Watchful waiting. This is often suggested if the hernia is small and you have had no symptoms. No medical procedure will be done unless symptoms develop. You will need to watch closely for symptoms. If any occur, contact your healthcare  provider right away. Surgery. This is used if the hernia is larger or you have symptoms. Open surgery. This is usually an outpatient procedure (you will not stay overnight in a hospital). An cut (incision) is made through the skin in the groin. The hernia is put back inside the abdomen. The weak area in the muscles is then repaired by herniorrhaphy or hernioplasty. Herniorrhaphy: in this type of surgery, the weak muscles are sewn back together. Hernioplasty: a patch or mesh is used to close the weak area in the abdominal wall. Laparoscopy. In this procedure, a surgeon makes small incisions. A thin tube with a tiny video camera (called a laparoscope) is put into the abdomen. The surgeon repairs the hernia with mesh by looking with the video camera and using two long instruments. HOME CARE INSTRUCTIONS  After surgery to repair an inguinal hernia: You will need to take pain medicine prescribed by your healthcare provider. Follow all directions carefully. You will need to take care of the wound from the incision. Your activity will be restricted for awhile. This will probably include no heavy lifting for several weeks. You also should not do anything too active for a few weeks. When you can return to work will depend on the type of job that you have. During "watchful waiting" periods, you should: Maintain a healthy weight. Eat a diet high in fiber (fruits, vegetables and whole grains). Drink plenty of fluids to avoid constipation. This means drinking enough water and other liquids to keep your urine clear or pale yellow. Do not lift heavy objects. Do not stand for long periods of time. Quit smoking. This should keep you from developing a frequent cough. SEEK MEDICAL CARE IF:  A bulge develops in your groin area. You feel pain, a burning sensation or pressure in the groin. This might be worse if you are lifting or straining. You develop a fever of more than 100.5 F (38.1 C). SEEK IMMEDIATE MEDICAL  CARE IF:  Pain in the groin increases suddenly. A bulge in the groin gets bigger suddenly and does not go down. For men, there is sudden pain in the scrotum. Or, the size of the scrotum increases. A bulge in the groin area becomes red or purple and is painful to touch. You have nausea or vomiting that does not go away. You feel your heart beating much faster than normal. You cannot have a bowel movement or pass gas. You develop a fever of more than 102.0 F (38.9 C).   This information is not intended to replace advice given to you by your health care provider. Make sure you discuss any questions you have with your health care provider.   Document Released: 11/19/2008 Document Revised: 09/25/2011 Document Reviewed: 01/04/2015 Elsevier Interactive Patient Education [employer]

## 2023-04-06 NOTE — H&P (View-Only) (Signed)
04/06/2023  Reason for Visit:  Right inguinal hernia  History of Present Illness: Cory Griffin. is a 37 y.o. male presenting for evaluation of a right inguinal hernia.  The patient presented to the ED twice on 01/22/23 and again on 04/03/23 with right groin discomfort.  On initial visit, was thought to have a strained groin muscle as no hernia was palpable.  However, the patient reports that he was feeling and noticing bulging in the right groin, particularly when doing heavy lifting at work.  On his second visit he had a CT scan of abdomen/pelvis which only showed an umbilical hernia, which the patient was unaware of.  Today, he reports that he continues to feel an area of bulging in his right groin.  He reports tenderness with bulging but the area reduces on its own and he's also able to push it in.  Denies any issues in the left groin.  Denies any nausea, vomiting, constipation.  Past Medical History: History reviewed. No pertinent past medical history.   Past Surgical History: Past Surgical History:  Procedure Laterality Date   INTRAMEDULLARY (IM) NAIL INTERTROCHANTERIC Right 07/02/2020   Procedure: INTRAMEDULLARY (IM) NAIL INTERTROCHANTRIC;  Surgeon: Roby Lofts, MD;  Location: [facility] OR;  Service: Orthopedics;  Laterality: Right;    Home Medications: Prior to Admission medications   Not on File    Allergies: No Known Allergies  Social History:  reports that he has been smoking cigarettes. He has never used smokeless tobacco. He reports current alcohol use of about 21.0 standard drinks of alcohol per week. He reports that he does not use drugs.   Family History: History reviewed. No pertinent family history.  Review of Systems: Review of Systems  Constitutional:  Negative for chills and fever.  HENT:  Negative for hearing loss.   Respiratory:  Negative for shortness of breath.   Cardiovascular:  Negative for chest pain.  Gastrointestinal:  Positive for abdominal pain (right  groin). Negative for nausea and vomiting.  Genitourinary:  Negative for dysuria.  Musculoskeletal:  Negative for myalgias.  Skin:  Negative for rash.  Neurological:  Negative for dizziness.  Psychiatric/Behavioral:  Negative for depression.     Physical Exam BP (!) 148/102 (BP Location: Right Arm, Patient Position: Sitting, Cuff Size: Large)   Pulse 91   Temp 98.5 F (36.9 C) (Oral)   Ht 6\' 1"  (1.854 m)   Wt 167 lb (75.8 kg)   SpO2 98%   BMI 22.03 kg/m  CONSTITUTIONAL: No acute distress, well nourished. HEENT:  Normocephalic, atraumatic, extraocular motion intact. NECK: Trachea is midline, and there is no jugular venous distension.  RESPIRATORY:  Lungs are clear, and breath sounds are equal bilaterally. Normal respiratory effort without pathologic use of accessory muscles. CARDIOVASCULAR: Heart is regular without murmurs, gallops, or rubs. GI: The abdomen is soft, non-distended, with some discomfort to palpation in the right groin.  He indeed has a reducible right inguinal hernia, which is overall small in size.  He also has a very small, likely 5 mm umbilical hernia which is also reducible, though with some discomfort as well. No evidence of strangulation.  No evidence of hernia in the left groin. MUSCULOSKELETAL:  Normal muscle strength and tone in all four extremities.  No peripheral edema or cyanosis. SKIN: Skin turgor is normal. There are no pathologic skin lesions.  NEUROLOGIC:  Motor and sensation is grossly normal.  Cranial nerves are grossly intact. PSYCH:  Alert and oriented to person, place and time.  Affect is normal.  Laboratory Analysis: Labs from 04/03/23: Na 138, K 4.0, Cl 104, CO2 25, BUN 10, Cr 0.87.  WBC 7.4, Hgb 15.5, Hct 44.5, Plt 215.  Imaging: CT abdomen/pelvis on 04/03/23: FINDINGS: Lower chest: The lung bases are clear. No pleural effusion. The heart is normal in size. No pericardial effusion.   Hepatobiliary: The liver is normal in size. Non-cirrhotic  configuration. No suspicious mass. No intrahepatic or extrahepatic bile duct dilation. No calcified gallstones. Normal gallbladder wall thickness. No pericholecystic inflammatory changes.   Pancreas: Unremarkable. No pancreatic ductal dilatation or surrounding inflammatory changes.   Spleen: Within normal limits. No focal lesion.   Adrenals/Urinary Tract: Adrenal glands are unremarkable. No suspicious renal mass. No hydronephrosis. No renal or ureteric calculi. Unremarkable urinary bladder.   Stomach/Bowel: No disproportionate dilation of the small or large bowel loops. No evidence of abnormal bowel wall thickening or inflammatory changes. The appendix is unremarkable.   Vascular/Lymphatic: No ascites or pneumoperitoneum. No abdominal or pelvic lymphadenopathy, by size criteria. No aneurysmal dilation of the major abdominal arteries.   Reproductive: Normal size prostate. Symmetric seminal vesicles.   Other: There is a tiny fat containing umbilical hernia. The soft tissues and abdominal wall are otherwise unremarkable.   Musculoskeletal: No suspicious osseous lesions. Right proximal femur metallic hardware is partially seen.   IMPRESSION: 1. No acute inflammatory process identified within the abdomen or pelvis. Normal appendix. 2. Multiple other nonacute observations, as described above.  Assessment and Plan: This is a 37 y.o. male with a right inguinal hernia and umbilical hernia.  --Discussed with the patient the findings on exam today.  Despite of his CT scan not really showing a defined right inguinal hernia, it is palpable on exam.  It is relatively small, and may be why it's not easily seen on imaging.  He also has an umbilical hernia.  Discussed with him what hernias are and that unfortunately there is no medical, dietary, or exercise management to repair the hernia, and only surgery would repair the hernia.  He is in agreement. --Discussed with him then the plan for a robotic  assisted right inguinal hernia repair with an open umbilical hernia repair.  Reviewed the surgery at length with him including the planned incisions, the risks of bleeding, infection, injury to surrounding structures, chronic pain, the ability to evaluate the left groin and repair any hernia if present, the use of mesh, that this would be an outpatient surgery, post-operative activity restrictions, pain control, and he's willing to proceed. --Will tentatively schedule the patient for surgery on 04/18/23.  All of his questions have been answered. --His blood pressure was elevated x 2 in the office.  Have recommended that he establish care with a PCP to help with blood pressure management.  Suggested that he can go to the offices in our building and ask if they are accepting new patients.  I spent 60 minutes dedicated to the care of this patient on the date of this encounter to include pre-visit review of records, face-to-face time with the patient discussing diagnosis and management, and any post-visit coordination of care.   Howie Ill, MD [facility]

## 2023-04-06 NOTE — Progress Notes (Unsigned)
04/06/2023  Reason for Visit:  Right inguinal hernia  History of Present Illness: Cory Griffin. is a 37 y.o. male presenting for evaluation of a right inguinal hernia.  The patient presented to the ED twice on 01/22/23 and again on 04/03/23 with right groin discomfort.  On initial visit, was thought to have a strained groin muscle as no hernia was palpable.  However, the patient reports that he was feeling and noticing bulging in the right groin, particularly when doing heavy lifting at work.  On his second visit he had a CT scan of abdomen/pelvis which only showed an umbilical hernia, which the patient was unaware of.  Today, he reports that he continues to feel an area of bulging in his right groin.  He reports tenderness with bulging but the area reduces on its own and he's also able to push it in.  Denies any issues in the left groin.  Denies any nausea, vomiting, constipation.  Past Medical History: History reviewed. No pertinent past medical history.   Past Surgical History: Past Surgical History:  Procedure Laterality Date   INTRAMEDULLARY (IM) NAIL INTERTROCHANTERIC Right 07/02/2020   Procedure: INTRAMEDULLARY (IM) NAIL INTERTROCHANTRIC;  Surgeon: Roby Lofts, MD;  Location: [facility] OR;  Service: Orthopedics;  Laterality: Right;    Home Medications: Prior to Admission medications   Not on File    Allergies: No Known Allergies  Social History:  reports that he has been smoking cigarettes. He has never used smokeless tobacco. He reports current alcohol use of about 21.0 standard drinks of alcohol per week. He reports that he does not use drugs.   Family History: History reviewed. No pertinent family history.  Review of Systems: Review of Systems  Constitutional:  Negative for chills and fever.  HENT:  Negative for hearing loss.   Respiratory:  Negative for shortness of breath.   Cardiovascular:  Negative for chest pain.  Gastrointestinal:  Positive for abdominal pain (right  groin). Negative for nausea and vomiting.  Genitourinary:  Negative for dysuria.  Musculoskeletal:  Negative for myalgias.  Skin:  Negative for rash.  Neurological:  Negative for dizziness.  Psychiatric/Behavioral:  Negative for depression.     Physical Exam BP (!) 148/102 (BP Location: Right Arm, Patient Position: Sitting, Cuff Size: Large)   Pulse 91   Temp 98.5 F (36.9 C) (Oral)   Ht 6\' 1"  (1.854 m)   Wt 167 lb (75.8 kg)   SpO2 98%   BMI 22.03 kg/m  CONSTITUTIONAL: No acute distress, well nourished. HEENT:  Normocephalic, atraumatic, extraocular motion intact. NECK: Trachea is midline, and there is no jugular venous distension.  RESPIRATORY:  Lungs are clear, and breath sounds are equal bilaterally. Normal respiratory effort without pathologic use of accessory muscles. CARDIOVASCULAR: Heart is regular without murmurs, gallops, or rubs. GI: The abdomen is soft, non-distended, with some discomfort to palpation in the right groin.  He indeed has a reducible right inguinal hernia, which is overall small in size.  He also has a very small, likely 5 mm umbilical hernia which is also reducible, though with some discomfort as well. No evidence of strangulation.  No evidence of hernia in the left groin. MUSCULOSKELETAL:  Normal muscle strength and tone in all four extremities.  No peripheral edema or cyanosis. SKIN: Skin turgor is normal. There are no pathologic skin lesions.  NEUROLOGIC:  Motor and sensation is grossly normal.  Cranial nerves are grossly intact. PSYCH:  Alert and oriented to person, place and time.  Affect is normal.  Laboratory Analysis: Labs from 04/03/23: Na 138, K 4.0, Cl 104, CO2 25, BUN 10, Cr 0.87.  WBC 7.4, Hgb 15.5, Hct 44.5, Plt 215.  Imaging: CT abdomen/pelvis on 04/03/23: FINDINGS: Lower chest: The lung bases are clear. No pleural effusion. The heart is normal in size. No pericardial effusion.   Hepatobiliary: The liver is normal in size. Non-cirrhotic  configuration. No suspicious mass. No intrahepatic or extrahepatic bile duct dilation. No calcified gallstones. Normal gallbladder wall thickness. No pericholecystic inflammatory changes.   Pancreas: Unremarkable. No pancreatic ductal dilatation or surrounding inflammatory changes.   Spleen: Within normal limits. No focal lesion.   Adrenals/Urinary Tract: Adrenal glands are unremarkable. No suspicious renal mass. No hydronephrosis. No renal or ureteric calculi. Unremarkable urinary bladder.   Stomach/Bowel: No disproportionate dilation of the small or large bowel loops. No evidence of abnormal bowel wall thickening or inflammatory changes. The appendix is unremarkable.   Vascular/Lymphatic: No ascites or pneumoperitoneum. No abdominal or pelvic lymphadenopathy, by size criteria. No aneurysmal dilation of the major abdominal arteries.   Reproductive: Normal size prostate. Symmetric seminal vesicles.   Other: There is a tiny fat containing umbilical hernia. The soft tissues and abdominal wall are otherwise unremarkable.   Musculoskeletal: No suspicious osseous lesions. Right proximal femur metallic hardware is partially seen.   IMPRESSION: 1. No acute inflammatory process identified within the abdomen or pelvis. Normal appendix. 2. Multiple other nonacute observations, as described above.  Assessment and Plan: This is a 37 y.o. male with a right inguinal hernia and umbilical hernia.  --Discussed with the patient the findings on exam today.  Despite of his CT scan not really showing a defined right inguinal hernia, it is palpable on exam.  It is relatively small, and may be why it's not easily seen on imaging.  He also has an umbilical hernia.  Discussed with him what hernias are and that unfortunately there is no medical, dietary, or exercise management to repair the hernia, and only surgery would repair the hernia.  He is in agreement. --Discussed with him then the plan for a robotic  assisted right inguinal hernia repair with an open umbilical hernia repair.  Reviewed the surgery at length with him including the planned incisions, the risks of bleeding, infection, injury to surrounding structures, chronic pain, the ability to evaluate the left groin and repair any hernia if present, the use of mesh, that this would be an outpatient surgery, post-operative activity restrictions, pain control, and he's willing to proceed. --Will tentatively schedule the patient for surgery on 04/18/23.  All of his questions have been answered. --His blood pressure was elevated x 2 in the office.  Have recommended that he establish care with a PCP to help with blood pressure management.  Suggested that he can go to the offices in our building and ask if they are accepting new patients.  I spent 60 minutes dedicated to the care of this patient on the date of this encounter to include pre-visit review of records, face-to-face time with the patient discussing diagnosis and management, and any post-visit coordination of care.   Howie Ill, MD [facility]

## 2023-04-09 ENCOUNTER — Telehealth: Payer: Self-pay | Admitting: Surgery

## 2023-04-09 NOTE — Telephone Encounter (Signed)
Patient has been advised of Pre-Admission date/time, and Surgery date at [facility].  Surgery Date: 04/18/23 Preadmission Testing Date: 04/11/23 (phone 8a-1p)  Patient has been made aware to call [phone], between 1-3:00pm the day before surgery, to find out what time to arrive for surgery.

## 2023-04-11 ENCOUNTER — Other Ambulatory Visit: Payer: Self-pay

## 2023-04-11 ENCOUNTER — Encounter
Admission: RE | Admit: 2023-04-11 | Discharge: 2023-04-11 | Disposition: A | Discharge status: Home / Self Care | Payer: PRIVATE HEALTH INSURANCE | Source: Ambulatory Visit | Attending: Surgery | Admitting: Surgery

## 2023-04-11 HISTORY — DX: Personal history of Methicillin resistant Staphylococcus aureus infection: Z86.14

## 2023-04-11 NOTE — Patient Instructions (Addendum)
Your procedure is scheduled on: 04/18/23 - Wednesday  Report to the Registration Desk on the 1st floor of the [facility]. To find out your arrival time, please call [phone] between 1PM - 3PM on: 04/17/23 - Tuesday If your arrival time is 6:00 am, do not arrive before that time as the [facility] entrance doors do not open until 6:00 am.  REMEMBER: Instructions that are not followed completely may result in serious medical risk, up to and including death; or upon the discretion of your surgeon and anesthesiologist your surgery may need to be rescheduled.  Do not eat food after midnight the night before surgery.  No gum chewing or hard candies.  You may however, drink CLEAR liquids up to 2 hours before you are scheduled to arrive for your surgery. Do not drink anything within 2 hours of your scheduled arrival time.  Clear liquids include: - water  - apple juice without pulp - gatorade (not RED colors) - black coffee or tea (Do NOT add milk or creamers to the coffee or tea) Do NOT drink anything that is not on this list.   One week prior to surgery: Stop Anti-inflammatories (NSAIDS) such as Advil, Aleve, Ibuprofen, Motrin, Naproxen, Naprosyn and Aspirin based products such as Excedrin, Goody's Powder, BC Powder. You may however, continue to take Tylenol if needed for pain up until the day of surgery.  Stop ANY OVER THE COUNTER supplements until after surgery.    TAKE ONLY THESE MEDICATIONS THE MORNING OF SURGERY WITH A SIP OF WATER:  NONE   No Alcohol for 24 hours before or after surgery.  No Smoking including e-cigarettes for 24 hours before surgery.  No chewable tobacco products for at least 6 hours before surgery.  No nicotine patches on the day of surgery.  Do not use any "recreational" drugs for at least a week (preferably 2 weeks) before your surgery.  Please be advised that the combination of cocaine and anesthesia may have negative outcomes, up to and including  death. If you test positive for cocaine, your surgery will be cancelled.  On the morning of surgery brush your teeth with toothpaste and water, you may rinse your mouth with mouthwash if you wish. Do not swallow any toothpaste or mouthwash.  Use CHG Soap or wipes as directed on instruction sheet.  Do not wear jewelry, make-up, hairpins, clips or nail polish.  For welded (permanent) jewelry: bracelets, anklets, waist bands, etc.  Please have this removed prior to surgery.  If it is not removed, there is a chance that hospital personnel will need to cut it off on the day of surgery.  Do not wear lotions, powders, or perfumes.   Do not shave body hair from the neck down 48 hours before surgery.  Contact lenses, hearing aids and dentures may not be worn into surgery.  Do not bring valuables to the hospital. [facility] is not responsible for any missing/lost belongings or valuables.   Notify your doctor if there is any change in your medical condition (cold, fever, infection).  Wear comfortable clothing (specific to your surgery type) to the hospital.  After surgery, you can help prevent lung complications by doing breathing exercises.  Take deep breaths and cough every 1-2 hours. Your doctor may order a device called an Incentive Spirometer to help you take deep breaths. When coughing or sneezing, hold a pillow firmly against your incision with both hands. This is called "splinting." Doing this helps protect your incision. It  also decreases belly discomfort.  If you are being admitted to the hospital overnight, leave your suitcase in the car. After surgery it may be brought to your room.  In case of increased patient census, it may be necessary for you, the patient, to continue your postoperative care in the Same Day Surgery department.  If you are being discharged the day of surgery, you will not be allowed to drive home. You will need a responsible individual to drive you home and  stay with you for 24 hours after surgery.   If you are taking public transportation, you will need to have a responsible individual with you.  Please call the Pre-admissions Testing Dept. at [phone] if you have any questions about these instructions.  Surgery Visitation Policy:  Patients having surgery or a procedure may have two visitors.  Children under the age of 22 must have an adult with them who is not the patient.  Inpatient Visitation:    Visiting hours are 7 a.m. to 8 p.m. Up to four visitors are allowed at one time in a patient room. The visitors may rotate out with other people during the day.  One visitor age 47 or older may stay with the patient overnight and must be in the room by 8 p.m.    Preparing for Surgery with CHLORHEXIDINE GLUCONATE (CHG) Soap  Chlorhexidine Gluconate (CHG) Soap  o An antiseptic cleaner that kills germs and bonds with the skin to continue killing germs even after washing  o Used for showering the night before surgery and morning of surgery  Before surgery, you can play an important role by reducing the number of germs on your skin.  CHG (Chlorhexidine gluconate) soap is an antiseptic cleanser which kills germs and bonds with the skin to continue killing germs even after washing.  Please do not use if you have an allergy to CHG or antibacterial soaps. If your skin becomes reddened/irritated stop using the CHG.  1. Shower the NIGHT BEFORE SURGERY and the MORNING OF SURGERY with CHG soap.  2. If you choose to wash your hair, wash your hair first as usual with your normal shampoo.  3. After shampooing, rinse your hair and body thoroughly to remove the shampoo.  4. Use CHG as you would any other liquid soap. You can apply CHG directly to the skin and wash gently with a scrungie or a clean washcloth.  5. Apply the CHG soap to your body only from the neck down. Do not use on open wounds or open sores. Avoid contact with your eyes, ears,  mouth, and genitals (private parts). Wash face and genitals (private parts) with your normal soap.  6. Wash thoroughly, paying special attention to the area where your surgery will be performed.  7. Thoroughly rinse your body with warm water.  8. Do not shower/wash with your normal soap after using and rinsing off the CHG soap.  9. Pat yourself dry with a clean towel.  10. Wear clean pajamas to bed the night before surgery.  12. Place clean sheets on your bed the night of your first shower and do not sleep with pets.  13. Shower again with the CHG soap on the day of surgery prior to arriving at the hospital.  14. Do not apply any deodorants/lotions/powders.  15. Please wear clean clothes to the hospital.

## 2023-04-18 ENCOUNTER — Other Ambulatory Visit: Payer: Self-pay

## 2023-04-18 ENCOUNTER — Ambulatory Visit: Payer: PRIVATE HEALTH INSURANCE | Admitting: Certified Registered"

## 2023-04-18 ENCOUNTER — Encounter
Admission: RE | Disposition: A | Discharge status: Home / Self Care | Payer: Self-pay | Source: Home / Self Care | Attending: Surgery

## 2023-04-18 ENCOUNTER — Ambulatory Visit
Admission: RE | Admit: 2023-04-18 | Discharge: 2023-04-18 | Disposition: A | Discharge status: Home / Self Care | Payer: PRIVATE HEALTH INSURANCE | Attending: Surgery | Admitting: Surgery

## 2023-04-18 ENCOUNTER — Encounter: Payer: Self-pay | Admitting: Surgery

## 2023-04-18 DIAGNOSIS — K4020 Bilateral inguinal hernia, without obstruction or gangrene, not specified as recurrent: Secondary | ICD-10-CM | POA: Diagnosis present

## 2023-04-18 DIAGNOSIS — K429 Umbilical hernia without obstruction or gangrene: Secondary | ICD-10-CM | POA: Diagnosis not present

## 2023-04-18 DIAGNOSIS — F17210 Nicotine dependence, cigarettes, uncomplicated: Secondary | ICD-10-CM | POA: Diagnosis not present

## 2023-04-18 DIAGNOSIS — K4090 Unilateral inguinal hernia, without obstruction or gangrene, not specified as recurrent: Secondary | ICD-10-CM

## 2023-04-18 DIAGNOSIS — R062 Wheezing: Secondary | ICD-10-CM | POA: Insufficient documentation

## 2023-04-18 DIAGNOSIS — R030 Elevated blood-pressure reading, without diagnosis of hypertension: Secondary | ICD-10-CM | POA: Insufficient documentation

## 2023-04-18 HISTORY — PX: UMBILICAL HERNIA REPAIR: SHX196

## 2023-04-18 SURGERY — HERNIORRHAPHY, INGUINAL, ROBOT-ASSISTED, LAPAROSCOPIC
Anesthesia: General | Site: Abdomen

## 2023-04-18 MED ORDER — CHLORHEXIDINE GLUCONATE 0.12 % MT SOLN
OROMUCOSAL | Status: AC
  Filled 2023-04-18: qty 15

## 2023-04-18 MED ORDER — PROPOFOL 10 MG/ML IV BOLUS
INTRAVENOUS | Status: DC | PRN
  Administered 2023-04-18: 200 mg via INTRAVENOUS

## 2023-04-18 MED ORDER — LIDOCAINE HCL (CARDIAC) PF 100 MG/5ML IV SOSY
PREFILLED_SYRINGE | INTRAVENOUS | Status: DC | PRN
  Administered 2023-04-18: 100 mg via INTRAVENOUS

## 2023-04-18 MED ORDER — CEFAZOLIN SODIUM-DEXTROSE 2-4 GM/100ML-% IV SOLN
INTRAVENOUS | Status: AC
  Filled 2023-04-18: qty 100

## 2023-04-18 MED ORDER — GABAPENTIN 300 MG PO CAPS
300.0000 mg | ORAL_CAPSULE | Freq: Once | ORAL | Status: AC
  Administered 2023-04-18: 300 mg via ORAL

## 2023-04-18 MED ORDER — ROCURONIUM BROMIDE 100 MG/10ML IV SOLN
INTRAVENOUS | Status: DC | PRN
  Administered 2023-04-18: 50 mg via INTRAVENOUS
  Administered 2023-04-18 (×2): 20 mg via INTRAVENOUS

## 2023-04-18 MED ORDER — KETAMINE HCL 50 MG/5ML IJ SOSY
PREFILLED_SYRINGE | INTRAMUSCULAR | Status: DC | PRN
  Administered 2023-04-18: 30 mg via INTRAVENOUS
  Administered 2023-04-18: 10 mg via INTRAVENOUS

## 2023-04-18 MED ORDER — CELECOXIB 200 MG PO CAPS
200.0000 mg | ORAL_CAPSULE | Freq: Once | ORAL | Status: AC
  Administered 2023-04-18: 200 mg via ORAL

## 2023-04-18 MED ORDER — MIDAZOLAM HCL 2 MG/2ML IJ SOLN
INTRAMUSCULAR | Status: AC
  Filled 2023-04-18: qty 2

## 2023-04-18 MED ORDER — FENTANYL CITRATE (PF) 100 MCG/2ML IJ SOLN
INTRAMUSCULAR | Status: AC
  Filled 2023-04-18: qty 2

## 2023-04-18 MED ORDER — FAMOTIDINE 20 MG PO TABS
ORAL_TABLET | ORAL | Status: AC
  Filled 2023-04-18: qty 1

## 2023-04-18 MED ORDER — ACETAMINOPHEN 500 MG PO TABS
1000.0000 mg | ORAL_TABLET | Freq: Once | ORAL | Status: AC
  Administered 2023-04-18: 1000 mg via ORAL

## 2023-04-18 MED ORDER — CHLORHEXIDINE GLUCONATE CLOTH 2 % EX PADS
6.0000 | MEDICATED_PAD | Freq: Once | CUTANEOUS | Status: AC
  Administered 2023-04-18: 6 via TOPICAL

## 2023-04-18 MED ORDER — GABAPENTIN 300 MG PO CAPS: 300.0000 mg | ORAL_CAPSULE | ORAL | Status: DC

## 2023-04-18 MED ORDER — ORAL CARE MOUTH RINSE: 15.0000 mL | Freq: Once | OROMUCOSAL | Status: AC

## 2023-04-18 MED ORDER — KETAMINE HCL 50 MG/5ML IJ SOSY
PREFILLED_SYRINGE | INTRAMUSCULAR | Status: AC
  Filled 2023-04-18: qty 5

## 2023-04-18 MED ORDER — FENTANYL CITRATE (PF) 100 MCG/2ML IJ SOLN
INTRAMUSCULAR | Status: DC | PRN
  Administered 2023-04-18 (×3): 50 ug via INTRAVENOUS

## 2023-04-18 MED ORDER — ACETAMINOPHEN 500 MG PO TABS
ORAL_TABLET | ORAL | Status: AC
  Filled 2023-04-18: qty 2

## 2023-04-18 MED ORDER — ACETAMINOPHEN 500 MG PO TABS: 1000.0000 mg | ORAL_TABLET | ORAL | Status: DC

## 2023-04-18 MED ORDER — GABAPENTIN 300 MG PO CAPS
ORAL_CAPSULE | ORAL | Status: AC
  Filled 2023-04-18: qty 1

## 2023-04-18 MED ORDER — ACETAMINOPHEN 10 MG/ML IV SOLN: 1000.0000 mg | Freq: Once | INTRAVENOUS | Status: DC | PRN

## 2023-04-18 MED ORDER — FAMOTIDINE 20 MG PO TABS
20.0000 mg | ORAL_TABLET | Freq: Once | ORAL | Status: AC
  Administered 2023-04-18: 20 mg via ORAL

## 2023-04-18 MED ORDER — DEXAMETHASONE SODIUM PHOSPHATE 10 MG/ML IJ SOLN
INTRAMUSCULAR | Status: DC | PRN
  Administered 2023-04-18: 10 mg via INTRAVENOUS

## 2023-04-18 MED ORDER — BUPIVACAINE LIPOSOME 1.3 % IJ SUSP: 20.0000 mL | Freq: Once | INTRAMUSCULAR | Status: DC

## 2023-04-18 MED ORDER — ONDANSETRON HCL 4 MG/2ML IJ SOLN
INTRAMUSCULAR | Status: AC
  Filled 2023-04-18: qty 2

## 2023-04-18 MED ORDER — BUPIVACAINE-EPINEPHRINE (PF) 0.5% -1:200000 IJ SOLN
INTRAMUSCULAR | Status: AC
  Filled 2023-04-18: qty 30

## 2023-04-18 MED ORDER — OXYCODONE HCL 5 MG PO TABS
ORAL_TABLET | ORAL | Status: AC
  Filled 2023-04-18: qty 1

## 2023-04-18 MED ORDER — DROPERIDOL 2.5 MG/ML IJ SOLN: 0.6250 mg | Freq: Once | INTRAMUSCULAR | Status: DC | PRN

## 2023-04-18 MED ORDER — LACTATED RINGERS IV SOLN: INTRAVENOUS | Status: DC

## 2023-04-18 MED ORDER — ONDANSETRON HCL 4 MG/2ML IJ SOLN
INTRAMUSCULAR | Status: DC | PRN
  Administered 2023-04-18: 4 mg via INTRAVENOUS

## 2023-04-18 MED ORDER — 0.9 % SODIUM CHLORIDE (POUR BTL) OPTIME
TOPICAL | Status: DC | PRN
  Administered 2023-04-18: 500 mL

## 2023-04-18 MED ORDER — OXYCODONE HCL 5 MG PO TABS
5.0000 mg | ORAL_TABLET | Freq: Once | ORAL | Status: AC | PRN
  Administered 2023-04-18: 5 mg via ORAL

## 2023-04-18 MED ORDER — MIDAZOLAM HCL 2 MG/2ML IJ SOLN
INTRAMUSCULAR | Status: DC | PRN
  Administered 2023-04-18: 2 mg via INTRAVENOUS

## 2023-04-18 MED ORDER — OXYCODONE HCL 5 MG PO TABS: 5.0000 mg | ORAL_TABLET | ORAL | 0 refills | Status: AC | PRN

## 2023-04-18 MED ORDER — OXYCODONE HCL 5 MG/5ML PO SOLN: 5.0000 mg | Freq: Once | ORAL | Status: AC | PRN

## 2023-04-18 MED ORDER — CELECOXIB 200 MG PO CAPS
ORAL_CAPSULE | ORAL | Status: AC
  Filled 2023-04-18: qty 1

## 2023-04-18 MED ORDER — CHLORHEXIDINE GLUCONATE 0.12 % MT SOLN
15.0000 mL | Freq: Once | OROMUCOSAL | Status: AC
  Administered 2023-04-18: 15 mL via OROMUCOSAL

## 2023-04-18 MED ORDER — ALBUTEROL SULFATE HFA 108 (90 BASE) MCG/ACT IN AERS
INHALATION_SPRAY | RESPIRATORY_TRACT | Status: DC | PRN
Start: 2023-04-18 — End: 2023-04-18
  Administered 2023-04-18: 2 via RESPIRATORY_TRACT

## 2023-04-18 MED ORDER — IBUPROFEN 600 MG PO TABS: 600.0000 mg | ORAL_TABLET | Freq: Three times a day (TID) | ORAL | 1 refills | Status: AC | PRN

## 2023-04-18 MED ORDER — FENTANYL CITRATE (PF) 100 MCG/2ML IJ SOLN
25.0000 ug | INTRAMUSCULAR | Status: DC | PRN
  Administered 2023-04-18 (×2): 50 ug via INTRAVENOUS

## 2023-04-18 MED ORDER — SUGAMMADEX SODIUM 200 MG/2ML IV SOLN
INTRAVENOUS | Status: DC | PRN
  Administered 2023-04-18: 150 mg via INTRAVENOUS

## 2023-04-18 MED ORDER — BUPIVACAINE LIPOSOME 1.3 % IJ SUSP
INTRAMUSCULAR | Status: AC
  Filled 2023-04-18: qty 20

## 2023-04-18 MED ORDER — ACETAMINOPHEN 10 MG/ML IV SOLN
INTRAVENOUS | Status: AC
  Filled 2023-04-18: qty 100

## 2023-04-18 MED ORDER — DEXMEDETOMIDINE HCL IN NACL 80 MCG/20ML IV SOLN
INTRAVENOUS | Status: DC | PRN
  Administered 2023-04-18: 8 ug via INTRAVENOUS
  Administered 2023-04-18: 12 ug via INTRAVENOUS

## 2023-04-18 MED ORDER — BUPIVACAINE-EPINEPHRINE (PF) 0.5% -1:200000 IJ SOLN
INTRAMUSCULAR | Status: DC | PRN
  Administered 2023-04-18: 50 mL via INTRAMUSCULAR

## 2023-04-18 MED ORDER — CEFAZOLIN SODIUM-DEXTROSE 2-4 GM/100ML-% IV SOLN
2.0000 g | INTRAVENOUS | Status: AC
  Administered 2023-04-18: 2 g via INTRAVENOUS

## 2023-04-18 MED ORDER — ACETAMINOPHEN 500 MG PO TABS: 1000.0000 mg | ORAL_TABLET | Freq: Four times a day (QID) | ORAL | Status: AC | PRN

## 2023-04-18 SURGICAL SUPPLY — 65 items
ADH SKN CLS APL DERMABOND .7 (GAUZE/BANDAGES/DRESSINGS) ×2
APL PRP STRL LF DISP 70% ISPRP (MISCELLANEOUS)
BLADE SURG 15 STRL LF DISP TIS (BLADE) ×2 IMPLANT
BLADE SURG 15 STRL SS (BLADE) ×2
CHLORAPREP W/TINT 26 (MISCELLANEOUS) ×2 IMPLANT
COVER TIP SHEARS 8 DVNC (MISCELLANEOUS) ×2 IMPLANT
COVER WAND RF STERILE (DRAPES) ×2 IMPLANT
DERMABOND ADVANCED .7 DNX12 (GAUZE/BANDAGES/DRESSINGS) ×2 IMPLANT
DRAPE ARM DVNC X/XI (DISPOSABLE) ×6 IMPLANT
DRAPE COLUMN DVNC XI (DISPOSABLE) ×2 IMPLANT
DRAPE LAPAROTOMY 77X122 PED (DRAPES) ×2 IMPLANT
ELECT CAUTERY BLADE TIP 2.5 (TIP) ×2
ELECT REM PT RETURN 9FT ADLT (ELECTROSURGICAL) ×2
ELECTRODE CAUTERY BLDE TIP 2.5 (TIP) ×2 IMPLANT
ELECTRODE REM PT RTRN 9FT ADLT (ELECTROSURGICAL) ×2 IMPLANT
FORCEPS BPLR R/ABLATION 8 DVNC (INSTRUMENTS) ×2 IMPLANT
GAUZE 4X4 16PLY ~~LOC~~+RFID DBL (SPONGE) ×2 IMPLANT
GLOVE SURG SYN 7.0 (GLOVE) ×4 IMPLANT
GLOVE SURG SYN 7.0 PF PI (GLOVE) ×4 IMPLANT
GLOVE SURG SYN 7.5 E (GLOVE) ×4 IMPLANT
GLOVE SURG SYN 7.5 PF PI (GLOVE) ×4 IMPLANT
GOWN STRL REUS W/ TWL LRG LVL3 (GOWN DISPOSABLE) ×8 IMPLANT
GOWN STRL REUS W/TWL LRG LVL3 (GOWN DISPOSABLE) ×8
HANDLE YANKAUER SUCT BULB TIP (MISCELLANEOUS) IMPLANT
IRRIGATION STRYKERFLOW (MISCELLANEOUS) ×2 IMPLANT
IRRIGATOR STRYKERFLOW (MISCELLANEOUS)
IV NS 1000ML (IV SOLUTION)
IV NS 1000ML BAXH (IV SOLUTION) IMPLANT
KIT PINK PAD W/HEAD ARE REST (MISCELLANEOUS) ×2
KIT PINK PAD W/HEAD ARM REST (MISCELLANEOUS) ×2 IMPLANT
LABEL OR SOLS (LABEL) ×2 IMPLANT
MANIFOLD NEPTUNE II (INSTRUMENTS) ×2 IMPLANT
MESH 3DMAX MID 4X6 LT LRG (Mesh General) IMPLANT
MESH 3DMAX MID 4X6 RT LRG (Mesh General) IMPLANT
NDL DRIVE SUT CUT DVNC (INSTRUMENTS) ×2 IMPLANT
NDL HYPO 22X1.5 SAFETY MO (MISCELLANEOUS) ×2 IMPLANT
NDL INSUFFLATION 14GA 120MM (NEEDLE) ×2 IMPLANT
NEEDLE DRIVE SUT CUT DVNC (INSTRUMENTS) ×2 IMPLANT
NEEDLE HYPO 22X1.5 SAFETY MO (MISCELLANEOUS) ×2 IMPLANT
NEEDLE INSUFFLATION 14GA 120MM (NEEDLE) ×2 IMPLANT
NS IRRIG 500ML POUR BTL (IV SOLUTION) ×2 IMPLANT
OBTURATOR OPTICAL STND 8 DVNC (TROCAR) ×2
OBTURATOR OPTICALSTD 8 DVNC (TROCAR) ×2 IMPLANT
PACK BASIN MINOR ARMC (MISCELLANEOUS) ×2 IMPLANT
PACK LAP CHOLECYSTECTOMY (MISCELLANEOUS) ×2 IMPLANT
PENCIL SMOKE EVACUATOR (MISCELLANEOUS) ×2 IMPLANT
SCISSORS MNPLR CVD DVNC XI (INSTRUMENTS) ×2 IMPLANT
SEAL UNIV 5-12 XI (MISCELLANEOUS) ×6 IMPLANT
SET TUBE SMOKE EVAC HIGH FLOW (TUBING) ×2 IMPLANT
SOL ELECTROSURG ANTI STICK (MISCELLANEOUS) ×2
SOLUTION ELECTROSURG ANTI STCK (MISCELLANEOUS) ×2 IMPLANT
SPONGE T-LAP 18X18 ~~LOC~~+RFID (SPONGE) ×2 IMPLANT
SUT ETHIBOND 0 MO6 C/R (SUTURE) ×2 IMPLANT
SUT MNCRL AB 4-0 PS2 18 (SUTURE) ×2 IMPLANT
SUT VIC AB 2-0 SH 27 (SUTURE) ×6
SUT VIC AB 2-0 SH 27XBRD (SUTURE) ×4 IMPLANT
SUT VIC AB 3-0 SH 27 (SUTURE) ×2
SUT VIC AB 3-0 SH 27X BRD (SUTURE) ×2 IMPLANT
SUT VICRYL 0 UR6 27IN ABS (SUTURE) ×4 IMPLANT
SUT VLOC 90 S/L VL9 GS22 (SUTURE) ×2 IMPLANT
SYR 20ML LL LF (SYRINGE) ×2 IMPLANT
TAPE TRANSPORE STRL 2 31045 (GAUZE/BANDAGES/DRESSINGS) ×2 IMPLANT
TRAP FLUID SMOKE EVACUATOR (MISCELLANEOUS) ×2 IMPLANT
TRAY FOLEY SLVR 16FR LF STAT (SET/KITS/TRAYS/PACK) ×2 IMPLANT
WATER STERILE IRR 500ML POUR (IV SOLUTION) ×2 IMPLANT

## 2023-04-18 NOTE — Anesthesia Postprocedure Evaluation (Signed)
Anesthesia Post Note  Patient: Cory Griffin.  Procedure(s) Performed: XI ROBOTIC ASSISTED INGUINAL HERNIA (Bilateral: Abdomen) HERNIA REPAIR UMBILICAL ADULT, open (Abdomen)  Patient location during evaluation: PACU Anesthesia Type: General Level of consciousness: awake and alert Pain management: pain level controlled Vital Signs Assessment: post-procedure vital signs reviewed and stable Respiratory status: spontaneous breathing, nonlabored ventilation, respiratory function stable and patient connected to nasal cannula oxygen Cardiovascular status: blood pressure returned to baseline and stable Postop Assessment: no apparent nausea or vomiting Anesthetic complications: no   No notable events documented.   Last Vitals:  Vitals:   04/18/23 1700 04/18/23 1713  BP: 129/84 (!) 147/92  Pulse: 84 70  Resp: 18 16  Temp: 36.6 C   SpO2: 95% (!) 9%    Last Pain:  Vitals:   04/18/23 1713  TempSrc: Oral  PainSc: 3                  Cleda Mccreedy Keryl Gholson

## 2023-04-18 NOTE — Anesthesia Procedure Notes (Signed)
Procedure Name: Intubation Date/Time: 04/18/2023 1:25 PM  Performed by: Cheral Bay, CRNAPre-anesthesia Checklist: Patient identified, Emergency Drugs available, Suction available and Patient being monitored Patient Re-evaluated:Patient Re-evaluated prior to induction Oxygen Delivery Method: Circle system utilized Preoxygenation: Pre-oxygenation with 100% oxygen Induction Type: IV induction Ventilation: Mask ventilation without difficulty Laryngoscope Size: McGraph and 4 Grade View: Grade I Tube type: Oral Tube size: 7.0 mm Number of attempts: 1 Airway Equipment and Method: Stylet Placement Confirmation: ETT inserted through vocal cords under direct vision, positive ETCO2 and breath sounds checked- equal and bilateral Secured at: 21 cm Tube secured with: Tape Dental Injury: Teeth and Oropharynx as per pre-operative assessment

## 2023-04-18 NOTE — Interval H&P Note (Signed)
History and Physical Interval Note:  04/18/2023 12:42 PM  Cory L Fischetti Jr.  has presented today for surgery, with the diagnosis of right inguinal hernia, reducible umbilical hernia less 3 cm.  The various methods of treatment have been discussed with the patient and family. After consideration of risks, benefits and other options for treatment, the patient has consented to  Procedure(s): XI ROBOTIC ASSISTED INGUINAL HERNIA (Right) HERNIA REPAIR UMBILICAL ADULT, open (N/A) as a surgical intervention.  The patient's history has been reviewed, patient examined, no change in status, stable for surgery.  I have reviewed the patient's chart and labs.  Questions were answered to the patient's satisfaction.     Dessie Tatem

## 2023-04-18 NOTE — Discharge Instructions (Addendum)
Discharge Instructions: 1.  Patient may shower, but do not scrub wounds heavily and dab dry only. 2.  Do not submerge wounds in pool/tub until fully healed. 3.  Do not apply ointments or hydrogen peroxide to the wounds. 4.  May apply ice packs to the wounds for comfort. 5.  It is normal for there to be puffiness/swelling/bruising at the groin and scrotum areas after surgery.  This related to the gas used for surgery and mild oozing that happens.  This will resolve on its own. 6.  Do not drive while taking narcotics for pain control.  Prior to driving, make sure you are able to rotate right and left to look at blindspots without significant pain or discomfort. 7.  No heavy lifting or pushing of more than 10-15 lbs for 6 weeks.   AMBULATORY SURGERY  DISCHARGE INSTRUCTIONS   The drugs that you were given will stay in your system until tomorrow so for the next 24 hours you should not:  Drive an automobile Make any legal decisions Drink any alcoholic beverage   You may resume regular meals tomorrow.  Today it is better to start with liquids and gradually work up to solid foods.  You may eat anything you prefer, but it is better to start with liquids, then soup and crackers, and gradually work up to solid foods.   Please notify your doctor immediately if you have any unusual bleeding, trouble breathing, redness and pain at the surgery site, drainage, fever, or pain not relieved by medication.    Additional Instructions:        Please contact your physician with any problems or Same Day Surgery at [phone], Monday through Friday 6 am to 4 pm, or [place] at [facility] number at [phone].

## 2023-04-18 NOTE — Op Note (Signed)
Procedure Date:  04/18/2023  Pre-operative Diagnosis:  Right inguinal hernia, umbilical hernia  Post-operative Diagnosis: Bilateral inguinal hernia, reducible umbilical hernia 1 cm.  Procedure: 1.  Robotic assisted Bilateral Inguinal Hernia Repair 2.  Creation of Bilateral Posterior Rectus-Transversalis Fascia Advancment Flap for Coverage of Pelvic Wound (200 cm) 3.  Open umbilical hernia repair  Surgeon:  Howie Ill, MD  Anesthesia:  General endotracheal  Estimated Blood Loss:  10 ml  Specimens:  None  Complications:  None  Indications for Procedure:  This is a 37 y.o. male who presents with a right inguinal hernia and an umbilical hernia.  The options of surgery versus observation were reviewed with the patient and/or family. The risks of bleeding, abscess or infection, recurrence of symptoms, potential for an open procedure, injury to surrounding structures, and chronic pain were all discussed with the patient and he was willing to proceed.  We have planned this transabdominal procedure with the creation of right peritoneal flap based on the posterior rectus sheath and transversalis fascia in order to fully cover the mesh, creating a natural tisssue barrier for the bowel and peritoneal cavity.  Description of Procedure: The patient was correctly identified in the preoperative area and brought into the operating room.  The patient was placed supine with VTE prophylaxis in place.  Appropriate time-outs were performed.  Anesthesia was induced and the patient was intubated.  Foley catheter was placed.  Appropriate antibiotics were infused.  The abdomen was prepped and draped in a sterile fashion. A supraumbilical incision was made. Cautery was used to dissect along the umbilical stalk and to separate the stalk from the underlying fascia.  This revealed a 1 cm hernia defect.  The fascial edges were cleared with cautery and a 12 mm robotic port was inserted.  Pneumoperitoneum was  obtained with appropriate opening pressures.  Camera was inserted and he was found to have bilateral inguinal hernias.  A Veress needle was used to start dissecting the peritoneal flap.  Two 8-mm robotic ports were placed in the right and left lateral positions under direct visualization.  A large right and left Bard 3D Max Mid Mesh, a 2-0 Vicryl, and two 2-0 vlock sutures were placed through the umbilical port under direct visualization.  The [employer] platform was docked onto the patient, the camera was inserted and targeted, and the instruments were placed under direct visualization.  Both inguinal regions were inspected for hernias and it was confirmed that the patient had bilateral inguinal hernias.  We started on the right side.  Using electocautery, the peritoneal and posterior rectus tissue flap was created.  The peritoneum on the right side was scored from the median umbilical ligament laterally towards the ASIS.  The flap was mobilized using robotic scissors and the bipolar instruments, creating a plane along the posterior rectus sheath and transversalis fascia down to the pubic tubercle medially. It was then further mobilized laterally across the inguinal canal and femoral vessels and onto the psoas muscle. The inferior epigastric vessels were identified and preserved. This created a posterior rectus and peritoneal flap measuring roughly 17 cm x 12 cm.  The hernia sac and contents were reduced preserving all structures.  A large right Bard 3D Max Mid mesh was placed with good overlap along all the potential hernia defects and secured in place with 2-0 Vicryl along the medial superomedial and superolateral aspects.    We then moved to the left side.  Using electocautery, the peritoneal and posterior rectus tissue  flap was created.  The peritoneum on the left side was scored from the median umbilical ligament laterally towards the ASIS.  The flap was mobilized using robotic scissors and the bipolar  instruments, creating a plane along the posterior rectus sheath and transversalis fascia down to the pubic tubercle medially. It was then further mobilized laterally across the inguinal canal and femoral vessels and onto the psoas muscle. The inferior epigastric vessels were identified and preserved. This created a posterior rectus and peritoneal flap measuring roughly 17 cm x 12 cm.  The hernia sac and contents were reduced preserving all structures.  A large left Bard 3D Max Mid mesh was placed with good overlap along all the potential hernia defects and secured in place with 2-0 Vicryl along the medial superomedial and superolateral aspects.  Then, the peritoneal flap on each side was advanced over the mesh and carried over to close the defect. A running 2-0 V lock suture was used to approximate the edge of the flap onto the peritoneum on each side.  All needles were removed under direct visualization.  The 8- mm ports were removed under direct visualization and the Hasson trocar was removed.  The umbilical hernia defect was closed using 0 vicryl sutures.  Local anesthetic was infused in all incisions as well as a bilateral ilioinguinal block, and the incisions were closed with 4-0 Monocryl.  The wounds were cleaned and sealed with DermaBond.  Foley catheter was removed and the patient was emerged from anesthesia and extubated and brought to the recovery room for further management.  The patient tolerated the procedure well and all counts were correct at the end of the case.   Howie Ill, MD

## 2023-04-18 NOTE — Anesthesia Preprocedure Evaluation (Addendum)
Anesthesia Evaluation  Patient identified by MRN, date of birth, ID band Patient awake    Reviewed: Allergy & Precautions, H&P , NPO status , Patient's Chart, lab work & pertinent test results  Airway Mallampati: II  TM Distance: >3 FB Neck ROM: full    Dental no notable dental hx. (+) Missing,    Pulmonary Current Smoker and Patient abstained from smoking.    + wheezing      Cardiovascular negative cardio ROS Normal cardiovascular exam     Neuro/Psych negative neurological ROS  negative psych ROS   GI/Hepatic negative GI ROS, Neg liver ROS,,,  Endo/Other  negative endocrine ROS    Renal/GU      Musculoskeletal   Abdominal Normal abdominal exam  (+)   Peds  Hematology negative hematology ROS (+)   Anesthesia Other Findings Past Medical History: 2024: Elevated BP without diagnosis of hypertension 2021: Fall     Comment:  with multiple fractures, Right Comminuted femur/femoral               neck fx Right rib fx 3-8 Right radial neck fx Right                Triquetral fx Right inferior orbit wall/max sinus fx               Right upper central incisor broken No date: Hx MRSA infection 03/2023: Right inguinal hernia 2024: STD (male)  Past Surgical History: No date: INCISE AND DRAIN ABCESS     Comment:  12 years laceration got infected 07/02/2020: INTRAMEDULLARY (IM) NAIL INTERTROCHANTERIC; Right     Comment:  Procedure: INTRAMEDULLARY (IM) NAIL INTERTROCHANTRIC;                Surgeon: Roby Lofts, MD;  Location: [facility] OR;  Service:              Orthopedics;  Laterality: Right;     Reproductive/Obstetrics negative OB ROS                             Anesthesia Physical Anesthesia Plan  ASA: 2  Anesthesia Plan: General ETT   Post-op Pain Management: Celebrex PO (pre-op)*, Gabapentin PO (pre-op)* and Tylenol PO (pre-op)*   Induction: Intravenous  PONV Risk Score and Plan: 2 and  Ondansetron, Dexamethasone and Midazolam  Airway Management Planned: Oral ETT  Additional Equipment:   Intra-op Plan:   Post-operative Plan: Extubation in OR  Informed Consent: I have reviewed the patients History and Physical, chart, labs and discussed the procedure including the risks, benefits and alternatives for the proposed anesthesia with the patient or authorized representative who has indicated his/her understanding and acceptance.     Dental Advisory Given  Plan Discussed with: CRNA and Surgeon  Anesthesia Plan Comments:         Anesthesia Quick Evaluation

## 2023-04-18 NOTE — Transfer of Care (Signed)
Immediate Anesthesia Transfer of Care Note  Patient: Cory Griffin.  Procedure(s) Performed: XI ROBOTIC ASSISTED INGUINAL HERNIA (Bilateral: Abdomen) HERNIA REPAIR UMBILICAL ADULT, open (Abdomen)  Patient Location: PACU  Anesthesia Type:General  Level of Consciousness: drowsy  Airway & Oxygen Therapy: Patient Spontanous Breathing and Patient connected to face mask oxygen  Post-op Assessment: Report given to RN, Post -op Vital signs reviewed and stable, and Patient moving all extremities X 4  Post vital signs: Reviewed and stable  Last Vitals:  Vitals Value Taken Time  BP 152/84 04/18/23 1613  Temp 36.7 C 04/18/23 1613  Pulse 96 04/18/23 1613  Resp 22 04/18/23 1613  SpO2 97 % 04/18/23 1613  Vitals shown include unfiled device data.  Last Pain:  Vitals:   04/18/23 1220  TempSrc: Oral  PainSc: 0-No pain         Complications: No notable events documented.

## 2023-04-19 ENCOUNTER — Encounter: Payer: Self-pay | Admitting: Surgery

## 2023-05-02 ENCOUNTER — Encounter: Payer: Self-pay | Admitting: Physician Assistant

## 2023-05-02 ENCOUNTER — Ambulatory Visit (INDEPENDENT_AMBULATORY_CARE_PROVIDER_SITE_OTHER): Payer: PRIVATE HEALTH INSURANCE | Admitting: Physician Assistant

## 2023-05-02 VITALS — BP 122/86 | HR 90 | Temp 98.0°F | Ht 73.0 in | Wt 166.0 lb

## 2023-05-02 DIAGNOSIS — Z09 Encounter for follow-up examination after completed treatment for conditions other than malignant neoplasm: Secondary | ICD-10-CM

## 2023-05-02 DIAGNOSIS — K4020 Bilateral inguinal hernia, without obstruction or gangrene, not specified as recurrent: Secondary | ICD-10-CM

## 2023-05-02 DIAGNOSIS — K4090 Unilateral inguinal hernia, without obstruction or gangrene, not specified as recurrent: Secondary | ICD-10-CM

## 2023-05-02 NOTE — Patient Instructions (Signed)

## 2023-05-02 NOTE — Progress Notes (Unsigned)
[facility] POST-OP OFFICE VISIT  05/02/2023  HPI: Cory Griffin. is a 37 y.o. male 14 days s/p robotic assisted laparoscopic bilateral inguinal hernia repair and open umbilical hernia repair with Dr Aleen Campi   Pain for the first 2-3 days was the worst Now just minimal soreness Only needing at bedtime pain medications No fever, chills, nausea, emesis Incisions are healing well No other complaints  Vital signs: BP 122/86   Pulse 90   Temp 98 F (36.7 C)   Ht 6\' 1"  (1.854 m)   Wt 166 lb (75.3 kg)   SpO2 98%   BMI 21.90 kg/m    Physical Exam: Constitutional: Well appearing male, NAD Abdomen: Soft, non-tender, non-distended, no rebound/guarding Skin: Laparoscopic incisions are healing well, no erythema or drainage   Assessment/Plan: This is a 36 y.o. male 14 days s/p robotic assisted laparoscopic bilateral inguinal hernia repair and open umbilical hernia repair with Dr Aleen Campi    - Pain control prn  - Reviewed wound care recommendation  - Reviewed lifting restrictions; 6 weeks total - work note given   - He can follow up on as needed basis; He understands to call with questions/concerns  -- Lynden Oxford, PA-C [facility] 05/02/2023, 3:40 PM M-F: 7am - 4pm
